# Patient Record
Sex: Male | Born: 1995 | Race: Asian | Hispanic: No | Marital: Single | Smoking: Never smoker
Health system: Southern US, Community
[De-identification: ages and names within clinical notes are randomized; demographics above are authoritative.]

## PROBLEM LIST (undated history)

## (undated) DIAGNOSIS — I10 Essential (primary) hypertension: Secondary | ICD-10-CM

## (undated) DIAGNOSIS — E538 Deficiency of other specified B group vitamins: Secondary | ICD-10-CM

## (undated) DIAGNOSIS — T7840XA Allergy, unspecified, initial encounter: Secondary | ICD-10-CM

## (undated) HISTORY — DX: Allergy, unspecified, initial encounter: T78.40XA

## (undated) HISTORY — DX: Deficiency of other specified B group vitamins: E53.8

## (undated) HISTORY — DX: Essential (primary) hypertension: I10

---

## 2018-08-10 ENCOUNTER — Ambulatory Visit (INDEPENDENT_AMBULATORY_CARE_PROVIDER_SITE_OTHER): Payer: PRIVATE HEALTH INSURANCE | Admitting: Internal Medicine

## 2018-08-10 ENCOUNTER — Encounter: Payer: Self-pay | Admitting: Internal Medicine

## 2018-08-10 ENCOUNTER — Other Ambulatory Visit: Payer: Self-pay

## 2018-08-10 VITALS — BP 110/80 | HR 87 | Temp 98.3°F | Ht 64.0 in | Wt 191.1 lb

## 2018-08-10 DIAGNOSIS — R05 Cough: Secondary | ICD-10-CM

## 2018-08-10 DIAGNOSIS — R053 Chronic cough: Secondary | ICD-10-CM

## 2018-08-10 NOTE — Patient Instructions (Signed)
-  Nice meeting you today!!  -Continue taking zyrtec/claritin every day.  -May use Delsym cough syrup twice a day as needed for cough.

## 2018-08-10 NOTE — Progress Notes (Signed)
New Patient Office Visit     CC/Reason for Visit: Establish care, evaluation of chronic cough Previous PCP: In [place] Last Visit: Winter 2019  HPI: Andrew Simpson is a 23 y.o. male who is coming in today for the above mentioned reasons.  He is without past medical history of significance.  He recently [place] from [facility].  He is currently unemployed.  He is a never smoker, does not drink alcohol, has never done drugs.  He does not have any family history of significance that he is aware of.  He states that for approximately the past 3 months he has had a cough that is worse at nighttime, was prescribed Zyrtec by his prior doctor and he stopped taking it because he has not noticed any difference.  He has no other symptoms like fever, shortness of breath, myalgias, no sick contacts, no recent travel other than moving from [place].   Past Medical/Surgical History: History reviewed. No pertinent past medical history.  History reviewed. No pertinent surgical history.  Social History:  reports that he has never smoked. He has never used smokeless tobacco. He reports that he does not drink alcohol or use drugs.  Allergies: No Known Allergies  Family History:  History reviewed. No pertinent family history.   Current Outpatient Medications:  .  cetirizine (ZYRTEC) 10 MG tablet, Take 10 mg by mouth daily., Disp: , Rfl:   Review of Systems:  Constitutional: Denies fever, chills, diaphoresis, appetite change and fatigue.  HEENT: Denies photophobia, eye pain, redness, hearing loss, ear pain, congestion, sore throat, rhinorrhea, sneezing, mouth sores, trouble swallowing, neck pain, neck stiffness and tinnitus.   Respiratory: Denies SOB, DOE, chest tightness,  and wheezing.   Cardiovascular: Denies chest pain, palpitations and leg swelling.  Gastrointestinal: Denies nausea, vomiting, abdominal pain, diarrhea, constipation, blood in stool and abdominal distention.   Genitourinary: Denies dysuria, urgency, frequency, hematuria, flank pain and difficulty urinating.  Endocrine: Denies: hot or cold intolerance, sweats, changes in hair or nails, polyuria, polydipsia. Musculoskeletal: Denies myalgias, back pain, joint swelling, arthralgias and gait problem.  Skin: Denies pallor, rash and wound.  Neurological: Denies dizziness, seizures, syncope, weakness, light-headedness, numbness and headaches.  Hematological: Denies adenopathy. Easy bruising, personal or family bleeding history  Psychiatric/Behavioral: Denies suicidal ideation, mood changes, confusion, nervousness, sleep disturbance and agitation    Physical Exam: Vitals:   08/10/18 1508  BP: 110/80  Pulse: 87  Temp: 98.3 F (36.8 C)  TempSrc: Oral  SpO2: 97%  Weight: 191 lb 1.6 oz (86.7 kg)  Height: 5\' 4"  (1.626 m)   Body mass index is 32.8 kg/m.  Constitutional: NAD, calm, comfortable Eyes: PERRL, lids and conjunctivae normal ENMT: Mucous membranes are moist. Posterior pharynx is erythematous but clear of any exudate or lesions. Normal dentition. Tympanic membrane is pearly white, no erythema or bulging. Neck: normal, supple, no masses, no thyromegaly, no lymphadenopathy Respiratory: clear to auscultation bilaterally, no wheezing, no crackles. Normal respiratory effort. No accessory muscle use.  Cardiovascular: Regular rate and rhythm, no murmurs / rubs / gallops. No extremity edema. 2+ pedal pulses. No carotid bruits.  Musculoskeletal: no clubbing / cyanosis. No joint deformity upper and lower extremities. Good ROM, no contractures. Normal muscle tone.  Psychiatric: Normal judgment and insight. Alert and oriented x 3. Normal mood.    Impression and Plan:  Chronic cough -Nothing on exam makes me believe that he needs imaging or antibiotic therapy. -Have asked him to resume use of Claritin,  he understands that seasonal allergies sometimes change with variation in location. -He may also use  other over-the-counter medications such as cough suppressants and Mucinex. -He will return to clinic if not improved or develops any additional symptoms.     Patient Instructions  -Nice meeting you today!!  -Continue taking zyrtec/claritin every day.  -May use Delsym cough syrup twice a day as needed for cough.     Chaya Jan, MD [facility]

## 2018-08-23 ENCOUNTER — Encounter (HOSPITAL_COMMUNITY): Payer: Self-pay

## 2018-08-23 ENCOUNTER — Other Ambulatory Visit: Payer: Self-pay

## 2018-08-23 ENCOUNTER — Emergency Department (HOSPITAL_COMMUNITY): Payer: PRIVATE HEALTH INSURANCE

## 2018-08-23 ENCOUNTER — Emergency Department (HOSPITAL_COMMUNITY)
Admission: EM | Admit: 2018-08-23 | Discharge: 2018-08-23 | Disposition: A | Discharge status: Home / Self Care | Payer: PRIVATE HEALTH INSURANCE | Attending: Emergency Medicine | Admitting: Emergency Medicine

## 2018-08-23 DIAGNOSIS — K6389 Other specified diseases of intestine: Secondary | ICD-10-CM

## 2018-08-23 DIAGNOSIS — Z79899 Other long term (current) drug therapy: Secondary | ICD-10-CM | POA: Insufficient documentation

## 2018-08-23 DIAGNOSIS — K529 Noninfective gastroenteritis and colitis, unspecified: Secondary | ICD-10-CM | POA: Diagnosis not present

## 2018-08-23 DIAGNOSIS — R109 Unspecified abdominal pain: Secondary | ICD-10-CM | POA: Diagnosis present

## 2018-08-23 LAB — LIPASE, BLOOD: Lipase: 21 U/L (ref 11–51)

## 2018-08-23 LAB — COMPREHENSIVE METABOLIC PANEL
ALT: 57 U/L — ABNORMAL HIGH (ref 0–44)
AST: 28 U/L (ref 15–41)
Albumin: 4.7 g/dL (ref 3.5–5.0)
Alkaline Phosphatase: 123 U/L (ref 38–126)
Anion gap: 9 (ref 5–15)
BUN: 7 mg/dL (ref 6–20)
CO2: 21 mmol/L — ABNORMAL LOW (ref 22–32)
Calcium: 9.3 mg/dL (ref 8.9–10.3)
Chloride: 108 mmol/L (ref 98–111)
Creatinine, Ser: 1 mg/dL (ref 0.61–1.24)
GFR calc Af Amer: >60 mL/min (ref >60)
GFR calc non Af Amer: >60 mL/min (ref >60)
Glucose, Bld: 102 mg/dL — ABNORMAL HIGH (ref 70–99)
Potassium: 3.5 mmol/L (ref 3.5–5.1)
Sodium: 138 mmol/L (ref 135–145)
Total Bilirubin: 2.7 mg/dL — ABNORMAL HIGH (ref 0.3–1.2)
Total Protein: 8.7 g/dL — ABNORMAL HIGH (ref 6.5–8.1)

## 2018-08-23 LAB — URINALYSIS, ROUTINE W REFLEX MICROSCOPIC
Bacteria, UA: NONE SEEN
Bilirubin Urine: NEGATIVE
Glucose, UA: NEGATIVE mg/dL
Ketones, ur: 5 mg/dL — AB
Leukocytes,Ua: NEGATIVE
Nitrite: NEGATIVE
Protein, ur: NEGATIVE mg/dL
Specific Gravity, Urine: 1.02 (ref 1.005–1.030)
pH: 6 (ref 5.0–8.0)

## 2018-08-23 LAB — CBC
HCT: 50.3 % (ref 39.0–52.0)
Hemoglobin: 16.5 g/dL (ref 13.0–17.0)
MCH: 31.5 pg (ref 26.0–34.0)
MCHC: 32.8 g/dL (ref 30.0–36.0)
MCV: 96 fL (ref 80.0–100.0)
Platelets: 364 10*3/uL (ref 150–400)
RBC: 5.24 MIL/uL (ref 4.22–5.81)
RDW: 11.7 % (ref 11.5–15.5)
WBC: 11.1 10*3/uL — ABNORMAL HIGH (ref 4.0–10.5)
nRBC: 0 % (ref 0.0–0.2)

## 2018-08-23 MED ORDER — SODIUM CHLORIDE (PF) 0.9 % IJ SOLN
INTRAMUSCULAR | Status: AC
  Filled 2018-08-23: qty 50

## 2018-08-23 MED ORDER — NAPROXEN 500 MG PO TABS: 500.0000 mg | ORAL_TABLET | Freq: Two times a day (BID) | ORAL | 0 refills | Status: DC

## 2018-08-23 MED ORDER — ONDANSETRON 4 MG PO TBDP: 4.0000 mg | ORAL_TABLET | Freq: Three times a day (TID) | ORAL | 0 refills | Status: DC | PRN

## 2018-08-23 MED ORDER — ONDANSETRON HCL 4 MG/2ML IJ SOLN: 4.0000 mg | INTRAMUSCULAR | Status: DC | PRN

## 2018-08-23 MED ORDER — MORPHINE SULFATE (PF) 4 MG/ML IV SOLN
4.0000 mg | Freq: Once | INTRAVENOUS | Status: AC
  Administered 2018-08-23: 4 mg via INTRAVENOUS
  Filled 2018-08-23 (×2): qty 1

## 2018-08-23 MED ORDER — SODIUM CHLORIDE 0.9% FLUSH
3.0000 mL | Freq: Once | INTRAVENOUS | Status: AC
  Administered 2018-08-23: 3 mL via INTRAVENOUS

## 2018-08-23 MED ORDER — SODIUM CHLORIDE 0.9 % IV SOLN
Freq: Once | INTRAVENOUS | Status: AC
  Administered 2018-08-23: 20:00:00 via INTRAVENOUS

## 2018-08-23 MED ORDER — IOHEXOL 300 MG/ML  SOLN
100.0000 mL | Freq: Once | INTRAMUSCULAR | Status: AC | PRN
  Administered 2018-08-23: 100 mL via INTRAVENOUS

## 2018-08-23 MED ORDER — KETOROLAC TROMETHAMINE 30 MG/ML IJ SOLN
30.0000 mg | Freq: Once | INTRAMUSCULAR | Status: DC
  Filled 2018-08-23: qty 1

## 2018-08-23 NOTE — ED Provider Notes (Signed)
[facility]   CSN: [id number] Arrival date & time: 08/23/18  1731    History   Chief Complaint Chief Complaint  Patient presents with   Abdominal Pain    HPI Andrew Simpson is a 23 y.o. male without PMH here for evaluation of abdominal pain. Onset 130 am last night. Initially around umbilicus, generalized. Persistent until now, localized to right mid/low abdomen. 8/10. Crampy, constant but worse with moving around and palpation.  Associated with one episode of non bloody watery diarrhea yesterday. No associated fever, nausea, vomiting, melena, hematochezia, urinary symptoms. No h/o abd surgeries. No interventions. No alleviating factors. No h/o kidney stones.      HPI  History reviewed. No pertinent past medical history.  There are no active problems to display for this patient.   History reviewed. No pertinent surgical history.      Home Medications    Prior to Admission medications   Medication Sig Start Date End Date Taking? Authorizing Provider  cetirizine (ZYRTEC) 10 MG tablet Take 10 mg by mouth daily.   Yes [provider]  Dextromethorphan Polistirex (DELSYM PO) Take 10 mLs by mouth daily as needed (cough).   Yes [provider]  naproxen (NAPROSYN) 500 MG tablet Take 1 tablet (500 mg total) by mouth 2 (two) times daily. 08/23/18   Liberty Handy, PA-C  ondansetron (ZOFRAN ODT) 4 MG disintegrating tablet Take 1 tablet (4 mg total) by mouth every 8 (eight) hours as needed for nausea or vomiting. 08/23/18   Liberty Handy, PA-C    Family History History reviewed. No pertinent family history.  Social History Social History   Tobacco Use   Smoking status: Never Smoker   Smokeless tobacco: Never Used  Substance Use Topics   Alcohol use: Never    Frequency: Never   Drug use: Never     Allergies   Patient has no known allergies.   Review of Systems Review of Systems    Gastrointestinal: Positive for abdominal pain and diarrhea.  All other systems reviewed and are negative.    Physical Exam Updated Vital Signs BP (!) 140/97    Pulse 96    Temp 98.9 F (37.2 C) (Oral)    Resp 17    SpO2 100%   Physical Exam Vitals signs and nursing note reviewed.  Constitutional:      Appearance: He is well-developed.     Comments: Non toxic.  HENT:     Head: Normocephalic and atraumatic.     Nose: Nose normal.  Eyes:     Conjunctiva/sclera: Conjunctivae normal.     Pupils: Pupils are equal, round, and reactive to light.  Neck:     Musculoskeletal: Normal range of motion.  Cardiovascular:     Rate and Rhythm: Normal rate and regular rhythm.     Pulses:          Radial pulses are 1+ on the right side and 1+ on the left side.       Dorsalis pedis pulses are 1+ on the right side and 1+ on the left side.     Heart sounds: Normal heart sounds.  Pulmonary:     Effort: Pulmonary effort is normal.     Breath sounds: Normal breath sounds.  Abdominal:     General: Bowel sounds are normal.     Palpations: Abdomen is soft.     Tenderness: There is abdominal tenderness. Positive signs include Rovsing's sign, McBurney's sign and  psoas sign.     Comments: No G/R/R. No suprapubic or CVA tenderness. Negative Murphy's. Active BS to lowe quadrants.   Musculoskeletal: Normal range of motion.  Skin:    General: Skin is warm and dry.     Capillary Refill: Capillary refill takes less than 2 seconds.  Neurological:     Mental Status: He is alert and oriented to person, place, and time.  Psychiatric:        Behavior: Behavior normal.        Thought Content: Thought content normal.        Judgment: Judgment normal.      ED Treatments / Results  Labs (all labs ordered are listed, but only abnormal results are displayed) Labs Reviewed  COMPREHENSIVE METABOLIC PANEL - Abnormal; Notable for the following components:      Result Value   CO2 21 (*)    Glucose, Bld 102 (*)     Total Protein 8.7 (*)    ALT 57 (*)    Total Bilirubin 2.7 (*)    All other components within normal limits  CBC - Abnormal; Notable for the following components:   WBC 11.1 (*)    All other components within normal limits  URINALYSIS, ROUTINE W REFLEX MICROSCOPIC - Abnormal; Notable for the following components:   Hgb urine dipstick SMALL (*)    Ketones, ur 5 (*)    All other components within normal limits  LIPASE, BLOOD    EKG None  Radiology Ct Abdomen Pelvis W Contrast  Result Date: 08/23/2018 CLINICAL DATA:  Initial evaluation for acute abdominal pain. EXAM: CT ABDOMEN AND PELVIS WITH CONTRAST TECHNIQUE: Multidetector CT imaging of the abdomen and pelvis was performed using the standard protocol following bolus administration of intravenous contrast. CONTRAST:  OMNIPAQUE IOHEXOL 300 MG/ML  SOLN COMPARISON:  None. FINDINGS: Lower chest: Minimal subsegmental atelectatic changes seen within the visualized lung bases. Visualized lungs are otherwise clear. Hepatobiliary: Liver demonstrates a normal contrast enhanced appearance. Focal fatty infiltration noted adjacent to the falciform ligament. Gallbladder within normal limits. No biliary dilatation. Pancreas: Pancreas within normal limits. Spleen: Spleen within normal limits. Adrenals/Urinary Tract: Adrenal glands are normal. Kidneys equal in size with symmetric enhancement no nephrolithiasis, hydronephrosis or focal enhancing renal mass. No hydroureter. Partially distended bladder within normal limits. Stomach/Bowel: Stomach within normal limits. No evidence for bowel obstruction. Focal somewhat well-circumscribed and ovoid area of inflammatory fat stranding seen along the anterior margin of the cecum (series 2, image 52). Overall appearance most characteristic for acute epiploic appendagitis. Central nodule densities within the area of inflammation likely small lymph nodes and/or thrombosed veins. Circumferential wall thickening with  mucosal edema seen within the adjacent cecum. Ileum and portions of the distal small bowel also decompressed with associated wall thickening, likely reactive. Appendix thick self seen extending inferiorly from the cecum, measuring up to 7 mm in diameter. While closely approximated to this inflammatory process, the epicenter of the inflammation felt to be about the cecum rather than the in the appendix itself. No evidence for acute appendicitis. No other acute inflammatory changes seen about the bowels. Vascular/Lymphatic: Normal intravascular enhancement seen throughout the intra-abdominal aorta. Mesenteric vessels patent proximally. No other adenopathy. Reproductive: Prostate normal. Other: No free air or fluid. Musculoskeletal: No acute osseous finding. No discrete lytic or blastic osseous lesions. IMPRESSION: 1. Focal inflammatory fat stranding adjacent to the cecum, felt to be most consistent with acute epiploic appendagitis. Associated mild reactive wall thickening and edema within the adjacent  cecum and distal ileum. 2. No other acute intra-abdominal or pelvic process. No evidence for acute appendicitis. Electronically Signed   By: Rise Mu M.D.   On: 08/23/2018 20:34    Procedures Procedures (including critical care time)  Medications Ordered in ED Medications  ondansetron (ZOFRAN) injection 4 mg (has no administration in time range)  sodium chloride (PF) 0.9 % injection (has no administration in time range)  ketorolac (TORADOL) 30 MG/ML injection 30 mg (30 mg Intravenous Refused 08/23/18 2058)  sodium chloride flush (NS) 0.9 % injection 3 mL (3 mLs Intravenous Given 08/23/18 1830)  0.9 %  sodium chloride infusion ( Intravenous Stopped 08/23/18 2135)  morphine 4 MG/ML injection 4 mg (4 mg Intravenous Given 08/23/18 1927)  iohexol (OMNIPAQUE) 300 MG/ML solution 100 mL (100 mLs Intravenous Contrast Given 08/23/18 2007)     Initial Impression / Assessment and Plan / ED Course  I have  reviewed the triage vital signs and the nursing notes.  Pertinent labs & imaging results that were available during my care of the patient were reviewed by me and considered in my medical decision making (see chart for details).  Clinical Course as of Aug 23 2138  Wed Aug 23, 2018  1923 WBC(!): 11.1 [CG]    Clinical Course User Index [CG] Liberty Handy, PA-C       ddx includes viral process vs appendicitis vs R sided diverticulitis. HD stable w/o distal paresthesias or neuro deficits and doubt AAA, dissection. No urinary symptoms. No h/o renal stones.  No SIRS. Will obtain labs, reassess. Consider CT vs [place].   2138: WBC 11.1. Total bilirubin 2.7, ALT 57.  CT AP shows epiploic appendagitis and local reactive inflammation.  Repeat abd exam improved. Pt tolerating PO. Will dc with NSAID and zofran. Return precautions given. Pt comfortable with this.   Final Clinical Impressions(s) / ED Diagnoses   Final diagnoses:  Epiploic appendagitis    ED Discharge Orders         Ordered    ondansetron (ZOFRAN ODT) 4 MG disintegrating tablet  Every 8 hours PRN     08/23/18 2130    naproxen (NAPROSYN) 500 MG tablet  2 times daily     08/23/18 2130           Jerrell Mylar 08/23/18 2140    Raeford Razor, MD 08/23/18 2230

## 2018-08-23 NOTE — ED Notes (Signed)
Pt given orange juice for PO challenge

## 2018-08-23 NOTE — ED Notes (Signed)
EDPA Provider at bedside. 

## 2018-08-23 NOTE — ED Notes (Signed)
LAB LABELS OFF LINE. SECRETARY MADE AWARE

## 2018-08-23 NOTE — Discharge Instructions (Addendum)
You were seen in the ED for abdominal pain.   CT shows inflammation of area around appendix but no appendicitis.  This is called epiploic appendagitis. This is treated with anti-inflammatories and nausea medicine  Take [phone] mg acetaminophen (tylenol) every 6 hours.  For more pain control you can take naproxen 500 mg every 12 hours.  Ondansetron for nausea.  Your pain may linger for 1-3 weeks but should improve with anti-inflammatories.   Return for worsening pain, vomiting, diarrhea, fevers, chills.

## 2018-08-23 NOTE — ED Triage Notes (Signed)
Pt reports severe lower abdominal cramping that started last night. He states that he had one episode of diarrhea last night around 230, but the pain persisted. He states that the pain has lessened now, but is still there. He denies any visible blood in his stool. No distress noted, but pt is tachycardic.

## 2018-10-05 ENCOUNTER — Encounter: Payer: Self-pay | Admitting: Internal Medicine

## 2018-10-05 ENCOUNTER — Other Ambulatory Visit: Payer: Self-pay

## 2018-10-05 ENCOUNTER — Ambulatory Visit (INDEPENDENT_AMBULATORY_CARE_PROVIDER_SITE_OTHER): Payer: PRIVATE HEALTH INSURANCE | Admitting: Internal Medicine

## 2018-10-05 VITALS — BP 104/70 | HR 65 | Temp 98.4°F | Wt 187.8 lb

## 2018-10-05 DIAGNOSIS — R202 Paresthesia of skin: Secondary | ICD-10-CM

## 2018-10-05 LAB — TSH: TSH: 1.18 u[IU]/mL (ref 0.35–4.50)

## 2018-10-05 LAB — VITAMIN B12: Vitamin B-12: 138 pg/mL — ABNORMAL LOW (ref 211–911)

## 2018-10-05 NOTE — Progress Notes (Signed)
     Established Patient Office Visit     CC/Reason for Visit: numbness and tingling of torso  HPI: Andrew Simpson is a 23 y.o. male who is coming in today for the above mentioned reasons. No PMH of significance. For 2-3 weeks has been having tingling of his right upper chest above the nipple line, radiating in a circular fashion to his back. Does not recall any heavy lifting or injury, no tick/insect bite, has not had a rash. Never had something like this before.   Past Medical/Surgical History: History reviewed. No pertinent past medical history.  No past surgical history on file.  Social History:  reports that he has never smoked. He has never used smokeless tobacco. He reports that he does not drink alcohol or use drugs.  Allergies: No Known Allergies  Family History:  No history that he is aware of, including heart disease, cancer or stroke.  Current Outpatient Medications:  .  diphenhydrAMINE (BENADRYL) 25 mg capsule, Take 25 mg by mouth every 6 (six) hours as needed., Disp: , Rfl:  .  ondansetron (ZOFRAN ODT) 4 MG disintegrating tablet, Take 1 tablet (4 mg total) by mouth every 8 (eight) hours as needed for nausea or vomiting., Disp: 20 tablet, Rfl: 0  Review of Systems:  Constitutional: Denies fever, chills, diaphoresis, appetite change and fatigue.  HEENT: Denies photophobia, eye pain, redness, hearing loss, ear pain, congestion, sore throat, rhinorrhea, sneezing, mouth sores, trouble swallowing, neck pain, neck stiffness and tinnitus.   Respiratory: Denies SOB, DOE, cough, chest tightness,  and wheezing.   Cardiovascular: Denies chest pain, palpitations and leg swelling.  Gastrointestinal: Denies nausea, vomiting, abdominal pain, diarrhea, constipation, blood in stool and abdominal distention.  Genitourinary: Denies dysuria, urgency, frequency, hematuria, flank pain and difficulty urinating.  Endocrine: Denies: hot or cold intolerance, sweats, changes in hair or  nails, polyuria, polydipsia. Musculoskeletal: Denies myalgias, back pain, joint swelling, arthralgias and gait problem.  Skin: Denies pallor, rash and wound.  Neurological: Denies dizziness, seizures, syncope, weakness, light-headedness, numbness and headaches.  Hematological: Denies adenopathy. Easy bruising, personal or family bleeding history  Psychiatric/Behavioral: Denies suicidal ideation, mood changes, confusion, nervousness, sleep disturbance and agitation    Physical Exam: Vitals:   10/05/18 1302  BP: 104/70  Pulse: 65  Temp: 98.4 F (36.9 C)  TempSrc: Oral  SpO2: 97%  Weight: 187 lb 12.8 oz (85.2 kg)    Body mass index is 32.24 kg/m.   Constitutional: NAD, calm, comfortable Eyes: PERRL, lids and conjunctivae normal, wears corrective lenses ENMT: Mucous membranes are moist.  Neck: normal, supple, no masses, no thyromegaly Respiratory: clear to auscultation bilaterally, no wheezing, no crackles. Skin: no rashes, lesions, ulcers. No induration Neurologic: grossly intact and non-focal. Psychiatric: Normal judgment and insight. Alert and oriented x 3. Normal mood.    Impression and Plan:  Tingling of skin  -Etiology remains unclear. -Doubt early shingles as it has been 3 weeks and he still has not had a rash of any sort. -Will check TSH and vit B12; if normal and tingling doesn't resolve over next 2 weeks, consider neurology referral.    Patient Instructions  -I hope you fell better soon!  -Lab work today; will notify you when results are available.  -Please let me know if you are still having issues in 2 weeks.       Chaya Jan, MD [facility]

## 2018-10-05 NOTE — Patient Instructions (Signed)
-  I hope you fell better soon!  -Lab work today; will notify you when results are available.  -Please let me know if you are still having issues in 2 weeks.

## 2018-10-06 ENCOUNTER — Telehealth: Payer: Self-pay | Admitting: *Deleted

## 2018-10-06 NOTE — Telephone Encounter (Signed)
Patient is scheduled for his 1st B12 injection in office and will be educated on how to self administer.

## 2018-10-06 NOTE — Telephone Encounter (Signed)
Patient is calling because he will receive his first B12 injection 10/09/2018.  Patient has questions concerning costs. Could you please help?

## 2018-10-06 NOTE — Telephone Encounter (Signed)
As we discussed during his visit, oral B12 is not an adequate option for replacement, at least not initially.

## 2018-10-06 NOTE — Telephone Encounter (Signed)
Out of pocket cost is $125 per injection when given in the office. Would it be possible to teach pt to self-administer for less expense?

## 2018-10-06 NOTE — Telephone Encounter (Signed)
Spoke with pt and provided CPT codes for B12 injections. He states his insurance has already advised him they will not cover B12 injections. Pt is concerned about cost. Pt would like to know if oral supplement would be an option. Please advise.

## 2018-10-06 NOTE — Telephone Encounter (Signed)
Patient is aware 

## 2018-10-09 ENCOUNTER — Other Ambulatory Visit: Payer: Self-pay

## 2018-10-09 ENCOUNTER — Ambulatory Visit (INDEPENDENT_AMBULATORY_CARE_PROVIDER_SITE_OTHER): Payer: PRIVATE HEALTH INSURANCE | Admitting: *Deleted

## 2018-10-09 DIAGNOSIS — E538 Deficiency of other specified B group vitamins: Secondary | ICD-10-CM | POA: Diagnosis not present

## 2018-10-09 MED ORDER — CYANOCOBALAMIN 1000 MCG/ML IJ SOLN
1000.0000 ug | Freq: Once | INTRAMUSCULAR | Status: AC
  Administered 2018-10-09: 1000 ug via INTRAMUSCULAR

## 2018-10-27 NOTE — Progress Notes (Signed)
Agree with B12 injection as given.

## 2018-11-23 ENCOUNTER — Other Ambulatory Visit: Payer: Self-pay

## 2018-11-23 ENCOUNTER — Ambulatory Visit (INDEPENDENT_AMBULATORY_CARE_PROVIDER_SITE_OTHER): Payer: PRIVATE HEALTH INSURANCE | Admitting: Internal Medicine

## 2018-11-23 DIAGNOSIS — R05 Cough: Secondary | ICD-10-CM

## 2018-11-23 DIAGNOSIS — E538 Deficiency of other specified B group vitamins: Secondary | ICD-10-CM

## 2018-11-23 DIAGNOSIS — R059 Cough, unspecified: Secondary | ICD-10-CM

## 2018-11-23 MED ORDER — BENZONATATE 100 MG PO CAPS: 100.0000 mg | ORAL_CAPSULE | Freq: Three times a day (TID) | ORAL | 0 refills | Status: DC | PRN

## 2018-11-23 NOTE — Progress Notes (Signed)
Virtual Visit via Video Note  I connected with Andrew Simpson on 11/23/18 at  1:30 PM EDT by a video enabled telemedicine application and verified that I am speaking with the correct person using two identifiers.  Location patient: home Location provider: work office Persons participating in the virtual visit: patient, provider  I discussed the limitations of evaluation and management by telemedicine and the availability of in person appointments. The patient expressed understanding and agreed to proceed.   HPI: He has scheduled this visit to discuss a cough. He has had it since Jan. It will sometimes produce clear sputum, will sometimes get coughing fits. Seen once in the office for it and was told he had an unspecified URI. He tried claritin and zyrtec OTC briefly without much relief. Tried OTC delsym as well. No fever, SOB, nasal congestion, sinus pain, no ear pain. No sick contacts. Is not working, has not left the house other than for groceries.  He would like to schedule a nurse visit for his B12 injection.   ROS: Constitutional: Denies fever, chills, diaphoresis, appetite change and fatigue.  HEENT: Denies photophobia, eye pain, redness, hearing loss, ear pain, congestion, sore throat, rhinorrhea, sneezing, mouth sores, trouble swallowing, neck pain, neck stiffness and tinnitus.   Respiratory: Denies SOB, DOE, cough, chest tightness,  and wheezing.   Cardiovascular: Denies chest pain, palpitations and leg swelling.  Gastrointestinal: Denies nausea, vomiting, abdominal pain, diarrhea, constipation, blood in stool and abdominal distention.  Genitourinary: Denies dysuria, urgency, frequency, hematuria, flank pain and difficulty urinating.  Endocrine: Denies: hot or cold intolerance, sweats, changes in hair or nails, polyuria, polydipsia. Musculoskeletal: Denies myalgias, back pain, joint swelling, arthralgias and gait problem.  Skin: Denies pallor, rash and wound.   Neurological: Denies dizziness, seizures, syncope, weakness, light-headedness, numbness and headaches.  Hematological: Denies adenopathy. Easy bruising, personal or family bleeding history  Psychiatric/Behavioral: Denies suicidal ideation, mood changes, confusion, nervousness, sleep disturbance and agitation   No past medical history on file.  No past surgical history on file.  No h/o heart disease, cancer or stroke in family that he is aware of.  SOCIAL HX:   reports that he has never smoked. He has never used smokeless tobacco. He reports that he does not drink alcohol or use drugs.   Current Outpatient Medications:  .  benzonatate (TESSALON) 100 MG capsule, Take 1 capsule (100 mg total) by mouth 3 (three) times daily as needed for cough., Disp: 30 capsule, Rfl: 0 .  ondansetron (ZOFRAN ODT) 4 MG disintegrating tablet, Take 1 tablet (4 mg total) by mouth every 8 (eight) hours as needed for nausea or vomiting., Disp: 20 tablet, Rfl: 0  EXAM:   VITALS per patient if applicable: none reported  GENERAL: alert, oriented, appears well and in no acute distress  HEENT: atraumatic, conjunttiva clear, no obvious abnormalities on inspection of external nose and ears  NECK: normal movements of the head and neck  LUNGS: on inspection no signs of respiratory distress, breathing rate appears normal, no obvious gross increased work of breathing, gasping or wheezing  CV: no obvious cyanosis  MS: moves all visible extremities without noticeable abnormality  PSYCH/NEURO: pleasant and cooperative, no obvious depression or anxiety, speech and thought processing grossly intact  ASSESSMENT AND PLAN:   Cough  -No concerning symptoms to suggest PNA, pharyngitis or ear infection. -Continue to follow conservatively: daily antihistamine use, will send a Rx for tessalon perles. -Does not want to be testing for COVID (I agree  he is low-test probability).  B12 deficiency -Will ask that he be  scheduled for in-office B12 injection.     I discussed the assessment and treatment plan with the patient. The patient was provided an opportunity to ask questions and all were answered. The patient agreed with the plan and demonstrated an understanding of the instructions.   The patient was advised to call back or seek an in-person evaluation if the symptoms worsen or if the condition fails to improve as anticipated.    Lelon Frohlich, MD  [facility] at [facility]

## 2018-12-20 ENCOUNTER — Ambulatory Visit: Payer: PRIVATE HEALTH INSURANCE

## 2019-01-08 ENCOUNTER — Other Ambulatory Visit: Payer: Self-pay

## 2019-01-08 ENCOUNTER — Ambulatory Visit (INDEPENDENT_AMBULATORY_CARE_PROVIDER_SITE_OTHER): Payer: PRIVATE HEALTH INSURANCE | Admitting: *Deleted

## 2019-01-08 DIAGNOSIS — E538 Deficiency of other specified B group vitamins: Secondary | ICD-10-CM | POA: Diagnosis not present

## 2019-01-08 MED ORDER — CYANOCOBALAMIN 1000 MCG/ML IJ SOLN
1000.0000 ug | Freq: Once | INTRAMUSCULAR | Status: AC
  Administered 2019-01-08: 1000 ug via INTRAMUSCULAR

## 2019-01-08 NOTE — Progress Notes (Signed)
Per orders of Dr. Burchette, injection of Cyanocobalamin 1000mcg given by Taler Kushner A. Patient tolerated injection well.  

## 2019-01-15 ENCOUNTER — Encounter: Payer: Self-pay | Admitting: Internal Medicine

## 2019-01-28 ENCOUNTER — Encounter: Payer: Self-pay | Admitting: Internal Medicine

## 2019-01-28 DIAGNOSIS — R1031 Right lower quadrant pain: Secondary | ICD-10-CM

## 2019-02-07 ENCOUNTER — Other Ambulatory Visit: Payer: Self-pay

## 2019-02-07 ENCOUNTER — Encounter: Payer: Self-pay | Admitting: Gastroenterology

## 2019-02-07 ENCOUNTER — Ambulatory Visit (INDEPENDENT_AMBULATORY_CARE_PROVIDER_SITE_OTHER): Payer: PRIVATE HEALTH INSURANCE | Admitting: *Deleted

## 2019-02-07 DIAGNOSIS — Z23 Encounter for immunization: Secondary | ICD-10-CM | POA: Diagnosis not present

## 2019-02-07 DIAGNOSIS — E538 Deficiency of other specified B group vitamins: Secondary | ICD-10-CM | POA: Diagnosis not present

## 2019-02-07 MED ORDER — CYANOCOBALAMIN 1000 MCG/ML IJ SOLN
1000.0000 ug | Freq: Once | INTRAMUSCULAR | Status: AC
  Administered 2019-02-07: 1000 ug via INTRAMUSCULAR

## 2019-02-07 NOTE — Progress Notes (Signed)
Per orders of Dr. Hernandez, injection of B12 given by Ronna Herskowitz. Patient tolerated injection well.  

## 2019-03-07 ENCOUNTER — Other Ambulatory Visit: Payer: Self-pay

## 2019-03-07 ENCOUNTER — Ambulatory Visit (INDEPENDENT_AMBULATORY_CARE_PROVIDER_SITE_OTHER): Payer: PRIVATE HEALTH INSURANCE | Admitting: *Deleted

## 2019-03-07 DIAGNOSIS — E538 Deficiency of other specified B group vitamins: Secondary | ICD-10-CM | POA: Diagnosis not present

## 2019-03-07 MED ORDER — CYANOCOBALAMIN 1000 MCG/ML IJ SOLN
1000.0000 ug | Freq: Once | INTRAMUSCULAR | Status: AC
  Administered 2019-03-07: 1000 ug via INTRAMUSCULAR

## 2019-03-07 NOTE — Progress Notes (Signed)
Per orders of Dr. Ethlyn Gallery, injection of Cyanocobalamin 1000ncg given by Agnes Lawrence. Patient tolerated injection well.

## 2019-03-14 ENCOUNTER — Ambulatory Visit (INDEPENDENT_AMBULATORY_CARE_PROVIDER_SITE_OTHER): Payer: PRIVATE HEALTH INSURANCE | Admitting: Gastroenterology

## 2019-03-14 VITALS — BP 118/80 | HR 87 | Temp 97.0°F | Ht 64.0 in | Wt 184.6 lb

## 2019-03-14 DIAGNOSIS — R933 Abnormal findings on diagnostic imaging of other parts of digestive tract: Secondary | ICD-10-CM

## 2019-03-14 DIAGNOSIS — R1031 Right lower quadrant pain: Secondary | ICD-10-CM | POA: Diagnosis not present

## 2019-03-14 MED ORDER — DICYCLOMINE HCL 10 MG PO CAPS: 10.0000 mg | ORAL_CAPSULE | Freq: Three times a day (TID) | ORAL | 1 refills | Status: DC | PRN

## 2019-03-14 NOTE — Progress Notes (Signed)
[facility] Consult Note:  History: Andrew PotashVictor Dai Simpson 03/14/2019  Referring provider: Philip AspenHernandez Simpson, Andrew PatriciaEstela Y, MD  Reason for consult/chief complaint: Abdominal Pain (comes and go for several years can last for at least a week, always lower right side)   Subjective  HPI:  This is a very pleasant 23 year old man referred by primary care for intermittent right lower quadrant pain.  It has occurred for the last few years, often happening at night for unclear reasons.  It is a dull right lower quadrant pain that may progress to a sharp pain that he rates as an 8 or 9 out of 10.  There is no associated nausea vomiting diarrhea constipation or rectal bleeding with these episodes.  Once an episode occurs, it may last days or up to a week afterwards.  He has not taken any particular medicines for it.  Some testing was done at an ED visit for the symptoms earlier in the year.  In between episodes he feels well.  Denies chronic upper digestive symptoms such as nausea, vomiting, early satiety, dysphagia and denies weight loss.   ROS:  Review of Systems  Constitutional: Negative for appetite change and unexpected weight change.  HENT: Negative for mouth sores and voice change.   Eyes: Negative for pain and redness.  Respiratory: Negative for cough and shortness of breath.   Cardiovascular: Negative for chest pain and palpitations.  Genitourinary: Negative for dysuria and hematuria.  Musculoskeletal: Negative for arthralgias and myalgias.  Skin: Negative for pallor and rash.  Neurological: Negative for weakness and headaches.  Hematological: Negative for adenopathy.     Past Medical History: Past Medical History:  Diagnosis Date  . B12 deficiency      Past Surgical History: History reviewed. No pertinent surgical history.   Family History: Family History  Problem Relation Age of Onset  . Liver cancer Father        [id number]  . Alcoholism Father      Social History: Social History   Socioeconomic History  . Marital status: Single    Spouse name: Not on file  . Number of children: Not on file  . Years of education: Not on file  . Highest education level: Not on file  Occupational History  . Not on file  Social Needs  . Financial resource strain: Not on file  . Food insecurity    Worry: Not on file    Inability: Not on file  . Transportation needs    Medical: Not on file    Non-medical: Not on file  Tobacco Use  . Smoking status: Never Smoker  . Smokeless tobacco: Never Used  Substance and Sexual Activity  . Alcohol use: Never    Frequency: Never  . Drug use: Never  . Sexual activity: Not on file  Lifestyle  . Physical activity    Days per week: Not on file    Minutes per session: Not on file  . Stress: Not on file  Relationships  . Social Musicianconnections    Talks on phone: Not on file    Gets together: Not on file    Attends religious service: Not on file    Active member of club or organization: Not on file    Attends meetings of clubs or organizations: Not on file    Relationship status: Not on file  Other Topics Concern  . Not on file  Social History Narrative  . Not on file   Recently moved to this  area from [place] to get some independence from his family.  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  Allergies: No Known Allergies  Outpatient Meds: Current Outpatient Medications  Medication Sig Dispense Refill  . dicyclomine (BENTYL) 10 MG capsule Take 1 capsule (10 mg total) by mouth every 8 (eight) hours as needed for spasms. 30 capsule 1   No current facility-administered medications for this visit.       ___________________________________________________________________ Objective   Exam:  BP 118/80   Pulse 87   Temp (!) 97 F (36.1 C)   Ht 5\' 4"  (1.626 m)   Wt 184 lb 9.6 oz (83.7 kg)   BMI 31.69 kg/m    General: Well-appearing  Eyes:  sclera anicteric, no redness  ENT: oral mucosa moist without lesions, no cervical or supraclavicular lymphadenopathy  CV: RRR without murmur, S1/S2, no JVD, no peripheral edema  Resp: clear to auscultation bilaterally, normal RR and effort noted  GI: soft, no tenderness, with active bowel sounds. No guarding or palpable organomegaly noted.  Skin; warm and dry, no rash or jaundice noted  Neuro: awake, alert and oriented x 3. Normal gross motor function and fluent speech  Labs:  CBC Latest Ref Rng & Units 08/23/2018  WBC 4.0 - 10.5 K/uL 11.1(H)  Hemoglobin 13.0 - 17.0 g/dL 08/25/2018  Hematocrit 00.9 - 52.0 % 50.3  Platelets 150 - 400 K/uL 364   CMP Latest Ref Rng & Units 08/23/2018  Glucose 70 - 99 mg/dL 08/25/2018)  BUN 6 - 20 mg/dL 7  Creatinine 829(H - 3.71 mg/dL 6.96  Sodium 7.89 - 381 mmol/L 138  Potassium 3.5 - 5.1 mmol/L 3.5  Chloride 98 - 111 mmol/L 108  CO2 22 - 32 mmol/L 21(L)  Calcium 8.9 - 10.3 mg/dL 9.3  Total Protein 6.5 - 8.1 g/dL 017)  Total Bilirubin 0.3 - 1.2 mg/dL 2.7(H)  Alkaline Phos 38 - 126 U/L 123  AST 15 - 41 U/L 28  ALT 0 - 44 U/L 57(H)     Radiologic Studies:  CLINICAL DATA:  Initial evaluation for acute abdominal pain.   EXAM: CT ABDOMEN AND PELVIS WITH CONTRAST   TECHNIQUE: Multidetector CT imaging of the abdomen and pelvis was performed using the standard protocol following bolus administration of intravenous contrast.   CONTRAST:  5.1(W OMNIPAQUE IOHEXOL 300 MG/ML  SOLN   COMPARISON:  None.   FINDINGS: Lower chest: Minimal subsegmental atelectatic changes seen within the visualized lung bases. Visualized lungs are otherwise clear.   Hepatobiliary: Liver demonstrates a normal contrast enhanced appearance. Focal fatty infiltration noted adjacent to the falciform ligament. Gallbladder within normal limits. No biliary dilatation.   Pancreas: Pancreas within normal limits.   Spleen: Spleen within normal limits.   Adrenals/Urinary Tract:  Adrenal glands are normal. Kidneys equal in size with symmetric enhancement no nephrolithiasis, hydronephrosis or focal enhancing renal mass. No hydroureter. Partially distended bladder within normal limits.   Stomach/Bowel: Stomach within normal limits. No evidence for bowel obstruction. Focal somewhat well-circumscribed and ovoid area of inflammatory fat stranding seen along the anterior margin of the cecum (series 2, image 52). Overall appearance most characteristic for acute epiploic appendagitis. Central nodule densities within the area of inflammation likely small lymph nodes and/or thrombosed veins. Circumferential wall thickening with mucosal edema seen within the adjacent cecum. Ileum and portions of the distal small bowel also decompressed with associated wall thickening, likely reactive. Appendix thick self seen extending inferiorly from  the cecum, measuring up to 7 mm in diameter. While closely approximated to this inflammatory process, the epicenter of the inflammation felt to be about the cecum rather than the in the appendix itself. No evidence for acute appendicitis. No other acute inflammatory changes seen about the bowels.   Vascular/Lymphatic: Normal intravascular enhancement seen throughout the intra-abdominal aorta. Mesenteric vessels patent proximally. No other adenopathy.   Reproductive: Prostate normal.   Other: No free air or fluid.   Musculoskeletal: No acute osseous finding. No discrete lytic or blastic osseous lesions.   IMPRESSION: 1. Focal inflammatory fat stranding adjacent to the cecum, felt to be most consistent with acute epiploic appendagitis. Associated mild reactive wall thickening and edema within the adjacent cecum and distal ileum. 2. No other acute intra-abdominal or pelvic process. No evidence for acute appendicitis.     Electronically Signed   By: Jeannine Boga M.D.   On: 08/23/2018 20:34   Assessment: Encounter  Diagnoses  Name Primary?  . RLQ abdominal pain Yes  . Abnormal finding on GI tract imaging     Symptoms somewhat difficult to characterize, no associated symptoms are clear triggers.  Given its location and CT findings, must consider Crohn's disease.  I recommended a colonoscopy, which he was reluctant to do without knowing the cost.  We gave him appropriate information so we can contact his insurance, and I am hopeful he will then contact us if he decides to schedule it. Meanwhile, I gave him a trial of dicyclomine to take as needed when symptoms occur.   Thank you for the courtesy of this consult.  Please call me with any questions or concerns.  Nelida Meuse III  CC: Referring provider noted above

## 2019-03-14 NOTE — Patient Instructions (Addendum)
If you are age 22 or older, your body mass index should be between 23-30. Your Body mass index is 31.69 kg/m. If this is out of the aforementioned range listed, please consider follow up with your Primary Care Provider.  If you are age 93 or younger, your body mass index should be between 19-25. Your Body mass index is 31.69 kg/m. If this is out of the aformentioned range listed, please consider follow up with your Primary Care Provider.   It has been recommended to you by your physician that you have a(n) Colonoscopy completed. Per your request, we did not schedule the procedure(s) today. Please contact our office at [phone] should you decide to have the procedure completed. You will be scheduled for a pre-visit and procedure at that time.  CPT for a colonoscopy [phone]  It was a pleasure to see you today!  Dr. Loletha Carrow

## 2019-03-15 ENCOUNTER — Encounter: Payer: Self-pay | Admitting: Gastroenterology

## 2019-03-23 ENCOUNTER — Other Ambulatory Visit: Payer: Self-pay

## 2019-03-23 ENCOUNTER — Telehealth (INDEPENDENT_AMBULATORY_CARE_PROVIDER_SITE_OTHER): Payer: PRIVATE HEALTH INSURANCE | Admitting: Family Medicine

## 2019-03-23 DIAGNOSIS — R059 Cough, unspecified: Secondary | ICD-10-CM

## 2019-03-23 DIAGNOSIS — M542 Cervicalgia: Secondary | ICD-10-CM | POA: Diagnosis not present

## 2019-03-23 DIAGNOSIS — R05 Cough: Secondary | ICD-10-CM | POA: Diagnosis not present

## 2019-03-23 MED ORDER — FEXOFENADINE HCL 180 MG PO TABS: 180.0000 mg | ORAL_TABLET | Freq: Every day | ORAL | 0 refills | Status: DC

## 2019-03-23 NOTE — Progress Notes (Signed)
Virtual Visit via Video Note  I connected with Andrew Simpson (pronounced New-yen) on April 07, 2019 at  4:30 PM EDT by a video enabled telemedicine application 2/2 DJSHF-02 pandemic and verified that I am speaking with the correct person using two identifiers.  Location patient: home Location provider:work or home office Persons participating in the virtual visit: patient, provider  I discussed the limitations of evaluation and management by telemedicine and the availability of in person appointments. The patient expressed understanding and agreed to proceed.   HPI: Pt is a 23 year old male with past medical history significant for vitamin B12 deficiency followed by Dr. Jerilee Hoh.  Pt seen for acute concern, pain in R side of neck x 2-3 days.  Notices more when he speaks.  Also endorses coughing since January. Was told he had allergies, but did not notice a difference in symptoms with claritin, zyrtec, or bendryl.  Denies edema of neck, pain with movement of head/neck, sore throat, fever, HA, rhinorrhea ear pain/pressure, facial pain/pressure nausea/vomiting, heartburn, changes in voice, sick contacts.  Endorses a tingling burn in chest that he associates with B12 def.  States eating bread, rice, chicken.   ROS: See pertinent positives and negatives per HPI.  Past Medical History:  Diagnosis Date  . B12 deficiency     No past surgical history on file.  Family History  Problem Relation Age of Onset  . Liver cancer Father        1-42  . Alcoholism Father     Current Outpatient Medications:  .  dicyclomine (BENTYL) 10 MG capsule, Take 1 capsule (10 mg total) by mouth every 8 (eight) hours as needed for spasms., Disp: 30 capsule, Rfl: 1  EXAM:  VITALS per patient if applicable: RR between 63-78 bpm  GENERAL: alert, oriented, appears well and in no acute distress  HEENT: atraumatic, conjunctiva clear, no obvious abnormalities on inspection of external nose and ears  NECK: No visible  deformities, edema, erythema, lesions.  Normal movements of the head and neck.  No pain with flexion, extension, and lateral movement of head/neck.  LUNGS: on inspection no signs of respiratory distress, breathing rate appears normal, no obvious gross SOB, gasping or wheezing  CV: no obvious cyanosis  MS: moves all visible extremities without noticeable abnormality  PSYCH/NEURO: pleasant and cooperative, no obvious depression or anxiety, speech and thought processing grossly intact  ASSESSMENT AND PLAN:  Discussed the following assessment and plan:  Sore neck -Discussed possible causes including muscle strain, lymphadenopathy, eustachian tube dysfunction, abscess -Discussed supportive care including Tylenol, gargling with warm salt water, heat, massage -Patient given precautions for continued or worsening symptoms  Cough  -Discussed possible causes including allergies, asthma, medications - Plan: fexofenadine (ALLEGRA ALLERGY) 180 MG tablet -Patient advised to consider consider nasal spray -Patient inquires about allergy testing.  Will follow up with PCP  Follow-up as needed PCP   I discussed the assessment and treatment plan with the patient. The patient was provided an opportunity to ask questions and all were answered. The patient agreed with the plan and demonstrated an understanding of the instructions.   The patient was advised to call back or seek an in-person evaluation if the symptoms worsen or if the condition fails to improve as anticipated.   Billie Ruddy, MD

## 2019-03-29 ENCOUNTER — Other Ambulatory Visit: Payer: Self-pay

## 2019-03-29 ENCOUNTER — Ambulatory Visit (AMBULATORY_SURGERY_CENTER): Payer: PRIVATE HEALTH INSURANCE | Admitting: *Deleted

## 2019-03-29 VITALS — Temp 97.9°F | Ht 64.0 in | Wt 185.0 lb

## 2019-03-29 DIAGNOSIS — R1031 Right lower quadrant pain: Secondary | ICD-10-CM

## 2019-03-29 DIAGNOSIS — R933 Abnormal findings on diagnostic imaging of other parts of digestive tract: Secondary | ICD-10-CM

## 2019-03-29 DIAGNOSIS — Z1159 Encounter for screening for other viral diseases: Secondary | ICD-10-CM

## 2019-03-29 HISTORY — PX: OTHER SURGICAL HISTORY: SHX169

## 2019-03-29 NOTE — Progress Notes (Signed)
No egg or soy allergy known to patient  No issues with past sedation with any surgeries  or procedures, no intubation problems  No diet pills per patient No home 02 use per patient  No blood thinners per patient  Pt denies issues with constipation  No A fib or A flutter  EMMI video sent to pt's e mail   Due to the COVID-19 pandemic we are asking patients to follow these guidelines. Please only bring one care partner. Please be aware that your care partner may wait in the car in the parking lot or if they feel like they will be too hot to wait in the car, they may wait in the lobby on the 4th floor. All care partners are required to wear a mask the entire time (we do not have any that we can provide them), they need to practice social distancing, and we will do a Covid check for all patient's and care partners when you arrive. Also we will check their temperature and your temperature. If the care partner waits in their car they need to stay in the parking lot the entire time and we will call them on their cell phone when the patient is ready for discharge so they can bring the car to the front of the building. Also all patient's will need to wear a mask into building.  SUPREP SAMPLE PROVIDED.  COVID SCREENING 04/09/19,1235

## 2019-04-03 ENCOUNTER — Encounter: Payer: Self-pay | Admitting: Gastroenterology

## 2019-04-09 ENCOUNTER — Other Ambulatory Visit: Payer: Self-pay | Admitting: Gastroenterology

## 2019-04-09 ENCOUNTER — Telehealth: Payer: Self-pay | Admitting: Gastroenterology

## 2019-04-09 NOTE — Telephone Encounter (Signed)
Pt is scheduled for a colon 04/12/19 and reported that he had eaten popcorn 04/08/19.  Does he need to reschedule?

## 2019-04-09 NOTE — Telephone Encounter (Signed)
Spoke with the patient. Explained that he does not have to reschedule but to follow the instructions from today to exam day. DO not eat any other foods on the list. Also I encouraged patient to drink plenty of fluids today and leading up to procedure to help flush out the popcorn. Pt verbalizes understanding. I reminded the patient of his covid screen test today-pt is aware! No further questions or concerns from the pt.

## 2019-04-10 LAB — SARS CORONAVIRUS 2 (TAT 6-24 HRS): SARS Coronavirus 2: NEGATIVE

## 2019-04-12 ENCOUNTER — Ambulatory Visit (AMBULATORY_SURGERY_CENTER): Payer: PRIVATE HEALTH INSURANCE | Admitting: Gastroenterology

## 2019-04-12 ENCOUNTER — Encounter: Payer: Self-pay | Admitting: Gastroenterology

## 2019-04-12 ENCOUNTER — Other Ambulatory Visit: Payer: Self-pay

## 2019-04-12 VITALS — BP 117/80 | HR 89 | Temp 98.8°F | Resp 17 | Ht 64.0 in | Wt 185.0 lb

## 2019-04-12 DIAGNOSIS — K5730 Diverticulosis of large intestine without perforation or abscess without bleeding: Secondary | ICD-10-CM | POA: Diagnosis not present

## 2019-04-12 DIAGNOSIS — R1031 Right lower quadrant pain: Secondary | ICD-10-CM

## 2019-04-12 DIAGNOSIS — R933 Abnormal findings on diagnostic imaging of other parts of digestive tract: Secondary | ICD-10-CM

## 2019-04-12 MED ORDER — SODIUM CHLORIDE 0.9 % IV SOLN: 500.0000 mL | Freq: Once | INTRAVENOUS | Status: DC

## 2019-04-12 NOTE — Op Note (Signed)
[facility] Patient Name: Andrew Simpson Procedure Date: 04/12/2019 10:59 AM MRN: [id number] Endoscopist: Sherilyn CooterHenry L. Myrtie Neitheranis , MD Age: [age] Referring MD:  Date of Birth: [birth year] Gender: Male Account #: [id number] Procedure:                Colonoscopy Indications:              Abdominal pain in the right lower quadrant                            (intermitent, years), Abnormal CT of the GI tract                            (suspected inflammation cecum/TI on CT scan 07/2018) Medicines:                Monitored Anesthesia Care Procedure:                Pre-Anesthesia Assessment:                           - Prior to the procedure, a History and Physical                            was performed, and patient medications and                            allergies were reviewed. The patient's tolerance of                            previous anesthesia was also reviewed. The risks                            and benefits of the procedure and the sedation                            options and risks were discussed with the patient.                            All questions were answered, and informed consent                            was obtained. Prior Anticoagulants: The patient has                            taken no previous anticoagulant or antiplatelet                            agents. ASA Grade Assessment: I - A normal, healthy                            patient. After reviewing the risks and benefits,                            the patient was deemed in satisfactory condition to  undergo the procedure.                           After obtaining informed consent, the colonoscope                            was passed under direct vision. Throughout the                            procedure, the patient's blood pressure, pulse, and                            oxygen saturations were monitored continuously. The                            Colonoscope was introduced  through the anus and                            advanced to the the terminal ileum, with                            identification of the appendiceal orifice and IC                            valve. The colonoscopy was performed without                            difficulty. The patient tolerated the procedure                            well. The quality of the bowel preparation was                            excellent. The terminal ileum, ileocecal valve,                            appendiceal orifice, and rectum were photographed. Scope In: 11:04:52 AM Scope Out: 11:12:36 AM Scope Withdrawal Time: 0 hours 6 minutes 45 seconds  Total Procedure Duration: 0 hours 7 minutes 44 seconds  Findings:                 The perianal and digital rectal examinations were                            normal.                           The terminal ileum appeared normal.                           Multiple diverticula were found in the proximal                            ascending colon.  The exam was otherwise without abnormality on                            direct and retroflexion views. Complications:            No immediate complications. Estimated Blood Loss:     Estimated blood loss: none. Impression:               - The examined portion of the ileum was normal.                           - Diverticulosis in the proximal ascending colon.                           - The examination was otherwise normal on direct                            and retroflexion views.                           - No specimens collected.                           No cause for pain seen on this exam. Patient                            recently prescribed a trial of dicyclomine to take                            during pain episodes. Recommendation:           - Patient has a contact number available for                            emergencies. The signs and symptoms of potential                             delayed complications were discussed with the                            patient. Return to normal activities tomorrow.                            Written discharge instructions were provided to the                            patient.                           - Resume previous diet.                           - Continue present medications.                           - No recommendation at this time regarding repeat  colonoscopy due to young age. Henry L. Myrtie Neither, MD 04/12/2019 11:20:44 AM This report has been signed electronically.

## 2019-04-12 NOTE — Progress Notes (Signed)
JB- Temp CW- Vitals 

## 2019-04-12 NOTE — Progress Notes (Signed)
Pt's states no medical or surgical changes since previsit or office visit. 

## 2019-04-12 NOTE — Patient Instructions (Signed)
Discharge instructions given. Normal exam. Resume previous medications. YOU HAD AN ENDOSCOPIC PROCEDURE TODAY AT THE [facility]:   Refer to the procedure report that was given to you for any specific questions about what was found during the examination.  If the procedure report does not answer your questions, please call your gastroenterologist to clarify.  If you requested that your care partner not be given the details of your procedure findings, then the procedure report has been included in a sealed envelope for you to review at your convenience later.  YOU SHOULD EXPECT: Some feelings of bloating in the abdomen. Passage of more gas than usual.  Walking can help get rid of the air that was put into your GI tract during the procedure and reduce the bloating. If you had a lower endoscopy (such as a colonoscopy or flexible sigmoidoscopy) you may notice spotting of blood in your stool or on the toilet paper. If you underwent a bowel prep for your procedure, you may not have a normal bowel movement for a few days.  Please Note:  You might notice some irritation and congestion in your nose or some drainage.  This is from the oxygen used during your procedure.  There is no need for concern and it should clear up in a day or so.  SYMPTOMS TO REPORT IMMEDIATELY:   Following lower endoscopy (colonoscopy or flexible sigmoidoscopy):  Excessive amounts of blood in the stool  Significant tenderness or worsening of abdominal pains  Swelling of the abdomen that is new, acute  Fever of 100F or higher   For urgent or emergent issues, a gastroenterologist can be reached at any hour by calling [phone].   DIET:  We do recommend a small meal at first, but then you may proceed to your regular diet.  Drink plenty of fluids but you should avoid alcoholic beverages for 24 hours.  ACTIVITY:  You should plan to take it easy for the rest of today and you should NOT DRIVE or use heavy machinery  until tomorrow (because of the sedation medicines used during the test).    FOLLOW UP: Our staff will call the number listed on your records 48-72 hours following your procedure to check on you and address any questions or concerns that you may have regarding the information given to you following your procedure. If we do not reach you, we will leave a message.  We will attempt to reach you two times.  During this call, we will ask if you have developed any symptoms of COVID 19. If you develop any symptoms (ie: fever, flu-like symptoms, shortness of breath, cough etc.) before then, please call [phone].  If you test positive for Covid 19 in the 2 weeks post procedure, please call and report this information to us.    If any biopsies were taken you will be contacted by phone or by letter within the next 1-3 weeks.  Please call us at [phone] if you have not heard about the biopsies in 3 weeks.    SIGNATURES/CONFIDENTIALITY: You and/or your care partner have signed paperwork which will be entered into your electronic medical record.  These signatures attest to the fact that that the information above on your After Visit Summary has been reviewed and is understood.  Full responsibility of the confidentiality of this discharge information lies with you and/or your care-partner. 

## 2019-04-12 NOTE — Progress Notes (Signed)
PT taken to PACU. Monitors in place. VSS. Report given to RN. 

## 2019-04-16 ENCOUNTER — Other Ambulatory Visit: Payer: Self-pay | Admitting: Family Medicine

## 2019-04-16 ENCOUNTER — Telehealth: Payer: Self-pay | Admitting: *Deleted

## 2019-04-16 DIAGNOSIS — R059 Cough, unspecified: Secondary | ICD-10-CM

## 2019-04-16 DIAGNOSIS — R05 Cough: Secondary | ICD-10-CM

## 2019-04-16 NOTE — Telephone Encounter (Signed)
Patient of Dr. Hernandez  

## 2019-04-16 NOTE — Telephone Encounter (Signed)
Left message on f/u call 

## 2019-04-16 NOTE — Telephone Encounter (Signed)
  Follow up Call-  Call back number 04/12/2019  Post procedure Call Back phone  # [phone]  Permission to leave phone message No     Patient questions:  Do you have a fever, pain , or abdominal swelling? No. Pain Score  0 *  Have you tolerated food without any problems? Yes.    Have you been able to return to your normal activities? Yes.    Do you have any questions about your discharge instructions: Diet   No. Medications  No. Follow up visit  No.  Do you have questions or concerns about your Care? No.  Actions: * If pain score is 4 or above: No action needed, pain <4.   1. Have you developed a fever since your procedure? no  2.   Have you had an respiratory symptoms (SOB or cough) since your procedure? no  3.   Have you tested positive for COVID 19 since your procedure no  4.   Have you had any family members/close contacts diagnosed with the COVID 19 since your procedure?  no   If yes to any of these questions please route to Joylene John, RN and Alphonsa Gin, Therapist, sports.

## 2019-04-20 ENCOUNTER — Ambulatory Visit (INDEPENDENT_AMBULATORY_CARE_PROVIDER_SITE_OTHER): Payer: PRIVATE HEALTH INSURANCE | Admitting: *Deleted

## 2019-04-20 ENCOUNTER — Other Ambulatory Visit: Payer: Self-pay

## 2019-04-20 DIAGNOSIS — E538 Deficiency of other specified B group vitamins: Secondary | ICD-10-CM | POA: Diagnosis not present

## 2019-04-20 MED ORDER — CYANOCOBALAMIN 1000 MCG/ML IJ SOLN
1000.0000 ug | Freq: Once | INTRAMUSCULAR | Status: AC
  Administered 2019-04-20: 1000 ug via INTRAMUSCULAR

## 2019-04-20 NOTE — Progress Notes (Signed)
Per orders of Dr. Hernandez, injection of B12 given by Larrisha Babineau. Patient tolerated injection well.  

## 2019-04-23 ENCOUNTER — Encounter: Payer: Self-pay | Admitting: Internal Medicine

## 2019-05-23 ENCOUNTER — Ambulatory Visit (INDEPENDENT_AMBULATORY_CARE_PROVIDER_SITE_OTHER): Payer: PRIVATE HEALTH INSURANCE | Admitting: *Deleted

## 2019-05-23 ENCOUNTER — Other Ambulatory Visit: Payer: Self-pay

## 2019-05-23 DIAGNOSIS — E538 Deficiency of other specified B group vitamins: Secondary | ICD-10-CM

## 2019-05-23 MED ORDER — CYANOCOBALAMIN 1000 MCG/ML IJ SOLN
1000.0000 ug | Freq: Once | INTRAMUSCULAR | Status: AC
  Administered 2019-05-23: 1000 ug via INTRAMUSCULAR

## 2019-05-23 NOTE — Progress Notes (Signed)
Per orders of Dr. Hernandez, injection of Vitamin B 12 given by [place] [facility] S [facility] Westrup. Patient tolerated injection well. 

## 2019-06-25 ENCOUNTER — Encounter: Payer: Self-pay | Admitting: Internal Medicine

## 2019-09-17 ENCOUNTER — Telehealth: Payer: Self-pay | Admitting: Internal Medicine

## 2019-09-17 NOTE — Telephone Encounter (Signed)
Pt sent a my-chart message to be scheduled for arm/neck pain and b12 inj. Due to pt being 24yrs old and [name] is his PCP but only seed 24 and up--Tanya will have to remove the modifier in order to schedule this pt when he calls back.   I left a VM for him to call back in order to schedule his appt he requested with [name].

## 2019-09-18 ENCOUNTER — Other Ambulatory Visit: Payer: Self-pay

## 2019-09-19 ENCOUNTER — Ambulatory Visit (INDEPENDENT_AMBULATORY_CARE_PROVIDER_SITE_OTHER): Payer: PRIVATE HEALTH INSURANCE | Admitting: Family Medicine

## 2019-09-19 ENCOUNTER — Encounter: Payer: Self-pay | Admitting: Family Medicine

## 2019-09-19 VITALS — BP 122/64 | HR 95 | Temp 97.9°F | Wt 183.2 lb

## 2019-09-19 DIAGNOSIS — E538 Deficiency of other specified B group vitamins: Secondary | ICD-10-CM

## 2019-09-19 DIAGNOSIS — M549 Dorsalgia, unspecified: Secondary | ICD-10-CM | POA: Diagnosis not present

## 2019-09-19 LAB — VITAMIN B12: Vitamin B-12: >1500 pg/mL — ABNORMAL HIGH (ref 211–911)

## 2019-09-19 MED ORDER — CYANOCOBALAMIN 1000 MCG/ML IJ SOLN
1000.0000 ug | Freq: Once | INTRAMUSCULAR | Status: AC
  Administered 2019-09-19: 1000 ug via INTRAMUSCULAR

## 2019-09-19 NOTE — Progress Notes (Signed)
Subjective:     Patient ID: Andrew Simpson, male   DOB: April 01, 1996, 24 y.o.   MRN: [id number]  HPI Andrew Simpson is seen today for the following issues  Left upper back pain.  He states it started about a week ago.  No injury.  Seems to be worse when he wakes up.  Radiates somewhat toward the left deltoid region.  No pain left shoulder.  Denies any lower cervical neck pain.  His pain is left trapezius area.  Spent some time on computer but no recent change in activities.  He works at [employer] and does some lifting.  He tried Advil but only took 200 mg.  Denies any upper extremity weakness or numbness.  No pain radiation beyond the proximal deltoid region.  Most of his pain is left trapezius area.  He has not tried any heat or ice     no prior history of neck difficulties.  History of B12 deficiency.  He had B12 low 138 last year and was started on B12 injections but has not had a now since December.  Not using any oral B12 supplementation.  He is not a vegetarian.  No history of gastric surgery.  Does not take any medications that would likely reduce B12 absorption.  Not aware of any family history of B12 deficiency.  Past Medical History:  Diagnosis Date  . Allergy   . B12 deficiency   . Hypertension    Past Surgical History:  Procedure Laterality Date  . NO PRIOR SURGERIES  03/29/2019    reports that he has never smoked. He has never used smokeless tobacco. He reports that he does not drink alcohol or use drugs. family history includes Alcoholism in his father; Liver cancer in his father. No Known Allergies '    Review of Systems  Constitutional: Negative for chills and fever.  Respiratory: Negative for cough and shortness of breath.   Cardiovascular: Negative for chest pain.  Musculoskeletal: Positive for back pain.  Skin: Negative for rash.  Neurological: Negative for weakness and numbness.  Hematological: Negative for adenopathy.       Objective:   Physical  Exam Vitals reviewed.  Constitutional:      Appearance: Normal appearance.  Cardiovascular:     Rate and Rhythm: Normal rate and regular rhythm.  Pulmonary:     Effort: Pulmonary effort is normal.     Breath sounds: Normal breath sounds.  Musculoskeletal:     Cervical back: Normal range of motion and neck supple.     Comments: Full range of motion left shoulder.  No pain with abduction against resistance.  He does have some tenderness along his left mid trapezius region.  Good range of motion neck.  Minimal pain with rotation lateral bending to the left  Lymphadenopathy:     Cervical: No cervical adenopathy.  Neurological:     Mental Status: He is alert.     Comments: Full strength upper extremities.  Symmetric reflexes.        Assessment:     #1 left upper back pain.  Suspect muscular.  Not clear this represents cervical radiculitis.  Nonfocal neuro exam.  #2 B12 deficiency.  He has gone several months without B12 injection.  No follow-up levels since initial low screen of 138 last year    Plan:     -Recheck B12 level -We recommend he follow-up with primary regarding his low B12.  He might benefit from trial of oral replacement with close follow-up -  We recommend over-the-counter Aleve or ibuprofen for his upper back pain.  Also suggested try heat or ice.  Also consider topical sports creams -Watch for any upper extremity numbness or weakness. -Follow-up with primary in 1 to 2 weeks if not improving  Kristian Covey MD [facility] at [facility]

## 2019-09-19 NOTE — Patient Instructions (Signed)
May take OTC Advil (up to 600-800 mg) every 8 hours with food- or Aleve twice daily  Try heat or ice to upper back  Consider muscle relaxer if pain persists.

## 2019-09-20 ENCOUNTER — Telehealth: Payer: Self-pay | Admitting: Internal Medicine

## 2019-09-20 NOTE — Telephone Encounter (Signed)
Spoke with patient and a follow up appointment with Dr [name] made.

## 2019-09-20 NOTE — Telephone Encounter (Signed)
The patient seen Dr. Caryl Never yesterday and they tried to contact him to tell him the results late yesterday afternoon. They left a message for the patient to contact the office.  The lab notes showed:  Andrew Covey, MD  09/19/2019 6:05 PM EDT    Follow-up B12 level is normal. I would suggest he start daily over-the-counter B12 1000 mcg and make sure he has follow-up with Dr. [name] within a few months to consider repeat level on oral replacement

## 2019-09-26 ENCOUNTER — Telehealth (INDEPENDENT_AMBULATORY_CARE_PROVIDER_SITE_OTHER): Payer: PRIVATE HEALTH INSURANCE | Admitting: Internal Medicine

## 2019-09-26 DIAGNOSIS — M79622 Pain in left upper arm: Secondary | ICD-10-CM | POA: Diagnosis not present

## 2019-09-26 MED ORDER — CYCLOBENZAPRINE HCL 5 MG PO TABS: 5.0000 mg | ORAL_TABLET | Freq: Two times a day (BID) | ORAL | 1 refills | Status: DC | PRN

## 2019-09-26 NOTE — Progress Notes (Signed)
Virtual Visit via Video Note  I connected with Amy Belloso on 09/26/19 at  3:30 PM EDT by a video enabled telemedicine application and verified that I am speaking with the correct person using two identifiers.  Location patient: home Location provider: work office Persons participating in the virtual visit: patient, provider  I discussed the limitations of evaluation and management by telemedicine and the availability of in person appointments. The patient expressed understanding and agreed to proceed.   HPI: He has scheduled this visit as a follow-up from his visit with Dr. Caryl Never from last week.  For about 2 weeks he has been having left upper arm pain.  He has full range of motion of his shoulder he has not had any redness or swelling, he has not had any recent injections in that arm.  He denies any fever.  He has had some intermittent sharp neck pain and some tingling down his left arm.  He has had to take a few days off work because of the pain.  He is wondering what he can do.   ROS: Constitutional: Denies fever, chills, diaphoresis, appetite change and fatigue.  HEENT: Denies photophobia, eye pain, redness, hearing loss, ear pain, congestion, sore throat, rhinorrhea, sneezing, mouth sores, trouble swallowing, neck pain, neck stiffness and tinnitus.   Respiratory: Denies SOB, DOE, cough, chest tightness,  and wheezing.   Cardiovascular: Denies chest pain, palpitations and leg swelling.  Gastrointestinal: Denies nausea, vomiting, abdominal pain, diarrhea, constipation, blood in stool and abdominal distention.  Genitourinary: Denies dysuria, urgency, frequency, hematuria, flank pain and difficulty urinating.  Endocrine: Denies: hot or cold intolerance, sweats, changes in hair or nails, polyuria, polydipsia. Musculoskeletal: Denies  back pain, joint swelling, arthralgias and gait problem.  Skin: Denies pallor, rash and wound.  Neurological: Denies dizziness, seizures,  syncope, weakness, light-headedness, numbness and headaches.  Hematological: Denies adenopathy. Easy bruising, personal or family bleeding history  Psychiatric/Behavioral: Denies suicidal ideation, mood changes, confusion, nervousness, sleep disturbance and agitation   Past Medical History:  Diagnosis Date  . Allergy   . B12 deficiency   . Hypertension     Past Surgical History:  Procedure Laterality Date  . NO PRIOR SURGERIES  03/29/2019    Family History  Problem Relation Age of Onset  . Liver cancer Father        32-42  . Alcoholism Father   . Colon cancer Neg Hx   . Colon polyps Neg Hx   . Esophageal cancer Neg Hx   . Rectal cancer Neg Hx   . Stomach cancer Neg Hx     SOCIAL HX:   reports that he has never smoked. He has never used smokeless tobacco. He reports that he does not drink alcohol or use drugs.   Current Outpatient Medications:  .  cyclobenzaprine (FLEXERIL) 5 MG tablet, Take 1 tablet (5 mg total) by mouth 2 (two) times daily as needed for muscle spasms., Disp: 30 tablet, Rfl: 1  EXAM:   VITALS per patient if applicable: None reported  GENERAL: alert, oriented, appears well and in no acute distress  HEENT: atraumatic, conjunttiva clear, no obvious abnormalities on inspection of external nose and ears  NECK: normal movements of the head and neck  LUNGS: on inspection no signs of respiratory distress, breathing rate appears normal, no obvious gross increased work of breathing, gasping or wheezing  CV: no obvious cyanosis  MS: moves all visible extremities without noticeable abnormality he has demonstrated full range of motion  of his shoulder on camera  PSYCH/NEURO: pleasant and cooperative, no obvious depression or anxiety, speech and thought processing grossly intact  ASSESSMENT AND PLAN:   Left upper arm pain  -Etiology unclear back to musculoskeletal.  I have advised icing, NSAIDs, local massage therapy, will give some muscle relaxers. -If  this continues, can consider cervical spine MRI given his neck pain and mild tingling in that area. -I will write him off from work until next week.    I discussed the assessment and treatment plan with the patient. The patient was provided an opportunity to ask questions and all were answered. The patient agreed with the plan and demonstrated an understanding of the instructions.   The patient was advised to call back or seek an in-person evaluation if the symptoms worsen or if the condition fails to improve as anticipated.    Lelon Frohlich, MD  [facility] at [facility]

## 2019-10-03 ENCOUNTER — Encounter: Payer: Self-pay | Admitting: Internal Medicine

## 2019-10-19 ENCOUNTER — Other Ambulatory Visit: Payer: Self-pay

## 2019-10-19 ENCOUNTER — Ambulatory Visit (INDEPENDENT_AMBULATORY_CARE_PROVIDER_SITE_OTHER): Payer: Self-pay | Admitting: *Deleted

## 2019-10-19 DIAGNOSIS — E538 Deficiency of other specified B group vitamins: Secondary | ICD-10-CM

## 2019-10-19 MED ORDER — CYANOCOBALAMIN 1000 MCG/ML IJ SOLN
1000.0000 ug | Freq: Once | INTRAMUSCULAR | Status: AC
  Administered 2019-10-19: 1000 ug via INTRAMUSCULAR

## 2019-10-19 NOTE — Progress Notes (Signed)
Patient in office today for B-12 injection. Injection administered with no immediate reaction 

## 2019-12-12 ENCOUNTER — Encounter: Payer: Self-pay | Admitting: Internal Medicine

## 2020-02-05 ENCOUNTER — Ambulatory Visit: Payer: PRIVATE HEALTH INSURANCE | Admitting: Internal Medicine

## 2020-02-11 ENCOUNTER — Encounter: Payer: Self-pay | Admitting: Internal Medicine

## 2020-02-19 ENCOUNTER — Ambulatory Visit: Payer: PRIVATE HEALTH INSURANCE | Admitting: Internal Medicine

## 2020-02-21 ENCOUNTER — Other Ambulatory Visit: Payer: Self-pay | Admitting: Internal Medicine

## 2020-02-21 ENCOUNTER — Ambulatory Visit (INDEPENDENT_AMBULATORY_CARE_PROVIDER_SITE_OTHER): Payer: PRIVATE HEALTH INSURANCE | Admitting: Internal Medicine

## 2020-02-21 ENCOUNTER — Other Ambulatory Visit: Payer: Self-pay

## 2020-02-21 VITALS — BP 124/76 | HR 100 | Temp 98.1°F | Wt 181.6 lb

## 2020-02-21 DIAGNOSIS — T1591XA Foreign body on external eye, part unspecified, right eye, initial encounter: Secondary | ICD-10-CM | POA: Diagnosis not present

## 2020-02-21 DIAGNOSIS — E669 Obesity, unspecified: Secondary | ICD-10-CM

## 2020-02-21 DIAGNOSIS — Z23 Encounter for immunization: Secondary | ICD-10-CM

## 2020-02-21 DIAGNOSIS — E538 Deficiency of other specified B group vitamins: Secondary | ICD-10-CM | POA: Diagnosis not present

## 2020-02-21 NOTE — Progress Notes (Signed)
Established Patient Office Visit     This visit occurred during the SARS-CoV-2 public health emergency.  Safety protocols were in place, including screening questions prior to the visit, additional usage of staff PPE, and extensive cleaning of exam room while observing appropriate contact time as indicated for disinfecting solutions.    CC/Reason for Visit: Discuss some acute concerns  HPI: Andrew Simpson is a 24 y.o. male who is coming in today for the above mentioned reasons.  1.  He would like to have his vitamin B12 levels checked as he has been on monthly IM injections now for close to a year and he would like to go to oral supplementation.  2.  For the past 5 days he has had irritation of his right eye.  He has tried natural tears without relief.  3.  He wants to know if it is okay for him to donate blood with his history of B12 deficiency.  4.  Requesting flu vaccination.   Past Medical/Surgical History: Past Medical History:  Diagnosis Date  . Allergy   . B12 deficiency   . Hypertension     Past Surgical History:  Procedure Laterality Date  . NO PRIOR SURGERIES  03/29/2019    Social History:  reports that he has never smoked. He has never used smokeless tobacco. He reports that he does not drink alcohol and does not use drugs.  Allergies: No Known Allergies  Family History:  Family History  Problem Relation Age of Onset  . Liver cancer Father        21-42  . Alcoholism Father   . Colon cancer Neg Hx   . Colon polyps Neg Hx   . Esophageal cancer Neg Hx   . Rectal cancer Neg Hx   . Stomach cancer Neg Hx     No current outpatient medications on file.  Review of Systems:  Constitutional: Denies fever, chills, diaphoresis, appetite change and fatigue.  HEENT: Denies photophobia, redness, hearing loss, ear pain, congestion, sore throat, rhinorrhea, sneezing, mouth sores, trouble swallowing, neck pain, neck stiffness and tinnitus.   Respiratory:  Denies SOB, DOE, cough, chest tightness,  and wheezing.   Cardiovascular: Denies chest pain, palpitations and leg swelling.  Gastrointestinal: Denies nausea, vomiting, abdominal pain, diarrhea, constipation, blood in stool and abdominal distention.  Genitourinary: Denies dysuria, urgency, frequency, hematuria, flank pain and difficulty urinating.  Endocrine: Denies: hot or cold intolerance, sweats, changes in hair or nails, polyuria, polydipsia. Musculoskeletal: Denies myalgias, back pain, joint swelling, arthralgias and gait problem.  Skin: Denies pallor, rash and wound.  Neurological: Denies dizziness, seizures, syncope, weakness, light-headedness, numbness and headaches.  Hematological: Denies adenopathy. Easy bruising, personal or family bleeding history  Psychiatric/Behavioral: Denies suicidal ideation, mood changes, confusion, nervousness, sleep disturbance and agitation    Physical Exam: Vitals:   02/21/20 1430  BP: 124/76  Pulse: 100  Temp: 98.1 F (36.7 C)  SpO2: 99%  Weight: 181 lb 9.6 oz (82.4 kg)    Body mass index is 31.17 kg/m.   Constitutional: NAD, calm, comfortable Eyes: PERRL, lids and conjunctivae normal, conjunctival and scleral erythema with 2 eyelashes in the lower conjunctiva, wears corrective lenses ENMT: Mucous membranes are moist. Respiratory: clear to auscultation bilaterally, no wheezing, no crackles. Normal respiratory effort. No accessory muscle use.  Cardiovascular: Regular rate and rhythm, no murmurs / rubs / gallops. No extremity edema. 2+ pedal pulses. No carotid bruits.  Neurologic: Grossly intact and nonfocal Psychiatric: Normal judgment and insight.  Alert and oriented x 3. Normal mood.    Impression and Plan:  Foreign body of right eye, initial encounter -Eyelashes removed, advised that he may continue to use natural tears as needed.  B12 deficiency -Per request, check levels today, if levels are acceptable he would like to try oral  supplements instead.  Need for influenza vaccination -Flu vaccine administered today.  Obesity (BMI 30.0-34.9) -Discussed healthy lifestyle, including increased physical activity and better food choices to promote weight loss.     Chaya Jan, MD [facility] at [facility]

## 2020-02-21 NOTE — Addendum Note (Signed)
Addended by: Kern Reap B on: 02/21/2020 02:57 PM   Modules accepted: Orders

## 2020-02-22 LAB — VITAMIN B12: Vitamin B-12: 295 pg/mL (ref 200–1100)

## 2020-03-13 ENCOUNTER — Ambulatory Visit: Payer: PRIVATE HEALTH INSURANCE

## 2020-03-13 ENCOUNTER — Other Ambulatory Visit: Payer: Self-pay

## 2020-03-13 DIAGNOSIS — E538 Deficiency of other specified B group vitamins: Secondary | ICD-10-CM

## 2020-03-13 MED ORDER — CYANOCOBALAMIN 1000 MCG/ML IJ SOLN
1000.0000 ug | Freq: Once | INTRAMUSCULAR | Status: AC
  Administered 2020-03-13: 1000 ug via INTRAMUSCULAR

## 2020-04-17 ENCOUNTER — Ambulatory Visit (INDEPENDENT_AMBULATORY_CARE_PROVIDER_SITE_OTHER): Payer: PRIVATE HEALTH INSURANCE | Admitting: Internal Medicine

## 2020-04-17 ENCOUNTER — Other Ambulatory Visit: Payer: Self-pay

## 2020-04-17 ENCOUNTER — Encounter: Payer: Self-pay | Admitting: Internal Medicine

## 2020-04-17 VITALS — BP 122/80 | HR 77 | Temp 98.1°F | Ht 64.0 in | Wt 187.5 lb

## 2020-04-17 DIAGNOSIS — L210 Seborrhea capitis: Secondary | ICD-10-CM

## 2020-04-17 DIAGNOSIS — E538 Deficiency of other specified B group vitamins: Secondary | ICD-10-CM

## 2020-04-17 DIAGNOSIS — H5789 Other specified disorders of eye and adnexa: Secondary | ICD-10-CM | POA: Diagnosis not present

## 2020-04-17 NOTE — Progress Notes (Signed)
Established Patient Office Visit     This visit occurred during the SARS-CoV-2 public health emergency.  Safety protocols were in place, including screening questions prior to the visit, additional usage of staff PPE, and extensive cleaning of exam room while observing appropriate contact time as indicated for disinfecting solutions.    CC/Reason for Visit: Discuss a couple acute concerns and chronic conditions  HPI: Andrew Simpson is a 24 y.o. male who is coming in today for the above mentioned reasons. Past Medical History is significant for: Vitamin B12 deficiency.  He is here for his monthly IM B12 today.  He has also been complaining of watery, itchy, red eyes now for about 6 weeks.  He has been using Claritin daily, he is not a contact lens wearer.  He has also been noticing a dry, flaky scalp.   Past Medical/Surgical History: Past Medical History:  Diagnosis Date  . Allergy   . B12 deficiency   . Hypertension     Past Surgical History:  Procedure Laterality Date  . NO PRIOR SURGERIES  03/29/2019    Social History:  reports that he has never smoked. He has never used smokeless tobacco. He reports that he does not drink alcohol and does not use drugs.  Allergies: No Known Allergies  Family History:  Family History  Problem Relation Age of Onset  . Liver cancer Father        56-42  . Alcoholism Father   . Colon cancer Neg Hx   . Colon polyps Neg Hx   . Esophageal cancer Neg Hx   . Rectal cancer Neg Hx   . Stomach cancer Neg Hx      Current Outpatient Medications:  .  loratadine (CLARITIN) 10 MG tablet, Take 10 mg by mouth daily., Disp: , Rfl:   Review of Systems:  Constitutional: Denies fever, chills, diaphoresis, appetite change and fatigue.  HEENT: Denies photophobia, eye pain, hearing loss, ear pain, congestion, sore throat, rhinorrhea, sneezing, mouth sores, trouble swallowing, neck pain, neck stiffness and tinnitus.   Respiratory: Denies SOB,  DOE, cough, chest tightness,  and wheezing.   Cardiovascular: Denies chest pain, palpitations and leg swelling.  Gastrointestinal: Denies nausea, vomiting, abdominal pain, diarrhea, constipation, blood in stool and abdominal distention.  Genitourinary: Denies dysuria, urgency, frequency, hematuria, flank pain and difficulty urinating.  Endocrine: Denies: hot or cold intolerance, sweats, changes in hair or nails, polyuria, polydipsia. Musculoskeletal: Denies myalgias, back pain, joint swelling, arthralgias and gait problem.  Skin: Denies pallor, rash and wound.  Neurological: Denies dizziness, seizures, syncope, weakness, light-headedness, numbness and headaches.  Hematological: Denies adenopathy. Easy bruising, personal or family bleeding history  Psychiatric/Behavioral: Denies suicidal ideation, mood changes, confusion, nervousness, sleep disturbance and agitation    Physical Exam: Vitals:   04/17/20 1003  BP: 122/80  Pulse: 77  Temp: 98.1 F (36.7 C)  TempSrc: Oral  SpO2: 98%  Weight: 187 lb 8 oz (85 kg)  Height: 5\' 4"  (1.626 m)    Body mass index is 32.18 kg/m.   Constitutional: NAD, calm, comfortable Eyes: PERRL, he has conjunctival injection. ENMT: Mucous membranes are moist. Respiratory: clear to auscultation bilaterally, no wheezing, no crackles. Normal respiratory effort. No accessory muscle use.  Cardiovascular: Regular rate and rhythm, no murmurs / rubs / gallops. No extremity edema. Scalp: Mild dandruff/flaky scalp noted. Neurologic: Grossly intact and nonfocal Psychiatric: Normal judgment and insight. Alert and oriented x 3. Normal mood.    Impression and Plan:  B12  deficiency -Monthly B12 injection today.  Flaky scalp -Mild case of dandruff noted. -Advised to go over his scalp gently with a fine tooth comb to loosen the flakes, then he is advised to use Selsun Blue shampoo daily.  Notify us if no improvement.  Itchy, watery, and red eye -Appears to be  allergic conjunctivitis, do not believe. -He will continue to use a daily antihistamine, have also advised olopatadine eyedrops daily. -He will contact us if no improvement.     Chaya Jan, MD [facility] at [facility]

## 2020-06-10 ENCOUNTER — Ambulatory Visit (INDEPENDENT_AMBULATORY_CARE_PROVIDER_SITE_OTHER): Payer: PRIVATE HEALTH INSURANCE | Admitting: *Deleted

## 2020-06-10 ENCOUNTER — Other Ambulatory Visit: Payer: Self-pay

## 2020-06-10 DIAGNOSIS — E538 Deficiency of other specified B group vitamins: Secondary | ICD-10-CM | POA: Diagnosis not present

## 2020-06-10 MED ORDER — CYANOCOBALAMIN 1000 MCG/ML IJ SOLN
1000.0000 ug | Freq: Once | INTRAMUSCULAR | Status: AC
  Administered 2020-06-10: 1000 ug via INTRAMUSCULAR

## 2020-06-10 NOTE — Progress Notes (Signed)
Per orders of Dr. Hernandez, injection of B12 given by [place]. Patient tolerated injection well.  

## 2020-06-30 ENCOUNTER — Encounter: Payer: Self-pay | Admitting: Internal Medicine

## 2020-07-20 ENCOUNTER — Emergency Department (HOSPITAL_COMMUNITY): Payer: Self-pay

## 2020-07-20 ENCOUNTER — Other Ambulatory Visit: Payer: Self-pay

## 2020-07-20 ENCOUNTER — Emergency Department (HOSPITAL_COMMUNITY)
Admission: EM | Admit: 2020-07-20 | Discharge: 2020-07-20 | Disposition: A | Discharge status: Home / Self Care | Payer: Self-pay | Attending: Emergency Medicine | Admitting: Emergency Medicine

## 2020-07-20 ENCOUNTER — Encounter: Payer: Self-pay | Admitting: Internal Medicine

## 2020-07-20 DIAGNOSIS — I10 Essential (primary) hypertension: Secondary | ICD-10-CM | POA: Insufficient documentation

## 2020-07-20 DIAGNOSIS — N23 Unspecified renal colic: Secondary | ICD-10-CM | POA: Insufficient documentation

## 2020-07-20 LAB — URINALYSIS, ROUTINE W REFLEX MICROSCOPIC
Bilirubin Urine: NEGATIVE
Glucose, UA: NEGATIVE mg/dL
Ketones, ur: 5 mg/dL — AB
Leukocytes,Ua: NEGATIVE
Nitrite: NEGATIVE
Protein, ur: NEGATIVE mg/dL
RBC / HPF: >50 RBC/hpf — ABNORMAL HIGH (ref 0–5)
Specific Gravity, Urine: 1.009 (ref 1.005–1.030)
pH: 6 (ref 5.0–8.0)

## 2020-07-20 LAB — COMPREHENSIVE METABOLIC PANEL
ALT: 39 U/L (ref 0–44)
AST: 38 U/L (ref 15–41)
Albumin: 4.2 g/dL (ref 3.5–5.0)
Alkaline Phosphatase: 114 U/L (ref 38–126)
Anion gap: 13 (ref 5–15)
BUN: 10 mg/dL (ref 6–20)
CO2: 21 mmol/L — ABNORMAL LOW (ref 22–32)
Calcium: 8.6 mg/dL — ABNORMAL LOW (ref 8.9–10.3)
Chloride: 106 mmol/L (ref 98–111)
Creatinine, Ser: 1.3 mg/dL — ABNORMAL HIGH (ref 0.61–1.24)
GFR, Estimated: >60 mL/min (ref >60)
Glucose, Bld: 117 mg/dL — ABNORMAL HIGH (ref 70–99)
Potassium: 3.5 mmol/L (ref 3.5–5.1)
Sodium: 140 mmol/L (ref 135–145)
Total Bilirubin: 1.5 mg/dL — ABNORMAL HIGH (ref 0.3–1.2)
Total Protein: 7.5 g/dL (ref 6.5–8.1)

## 2020-07-20 LAB — CBC
HCT: 47.5 % (ref 39.0–52.0)
Hemoglobin: 16 g/dL (ref 13.0–17.0)
MCH: 30.4 pg (ref 26.0–34.0)
MCHC: 33.7 g/dL (ref 30.0–36.0)
MCV: 90.1 fL (ref 80.0–100.0)
Platelets: 308 10*3/uL (ref 150–400)
RBC: 5.27 MIL/uL (ref 4.22–5.81)
RDW: 12.4 % (ref 11.5–15.5)
WBC: 7.5 10*3/uL (ref 4.0–10.5)
nRBC: 0 % (ref 0.0–0.2)

## 2020-07-20 MED ORDER — SODIUM CHLORIDE 0.9 % IV BOLUS
1000.0000 mL | Freq: Once | INTRAVENOUS | Status: AC
  Administered 2020-07-20: 1000 mL via INTRAVENOUS

## 2020-07-20 MED ORDER — FENTANYL CITRATE (PF) 100 MCG/2ML IJ SOLN
100.0000 ug | Freq: Once | INTRAMUSCULAR | Status: AC
  Administered 2020-07-20: 100 ug via INTRAVENOUS
  Filled 2020-07-20: qty 2

## 2020-07-20 MED ORDER — ONDANSETRON HCL 4 MG/2ML IJ SOLN
4.0000 mg | Freq: Once | INTRAMUSCULAR | Status: AC
  Administered 2020-07-20: 4 mg via INTRAVENOUS
  Filled 2020-07-20: qty 2

## 2020-07-20 NOTE — ED Notes (Signed)
Notified pt of need for urine sample. Pt unable to urinate at this time

## 2020-07-20 NOTE — ED Notes (Signed)
Provided pt w/labeled specimen cup for urine collection. ENMiles 

## 2020-07-20 NOTE — ED Provider Notes (Signed)
[facility]-EMERGENCY DEPT Provider Note: Lowella Dell, MD, FACEP  CSN: [id number] MRN: [id number] ARRIVAL: 07/20/20 at 0128 ROOM: PO24/MP53   CHIEF COMPLAINT  Flank Pain   HISTORY OF PRESENT ILLNESS  07/20/20 2:23 AM Andrew Simpson is a 25 y.o. male with left flank pain that began about 45 minutes prior to arrival.  The pain woke him out of sleep.  He rates the pain as a 9 out of 10.  He has never had similar pain in the past.  Nothing makes it better or worse.  He also feels dehydrated by which she means his throat feels dry and irritated when he swallows.  He was able to urinate at home, and noted no hematuria, but has not been able to urinate while in the ED.   Past Medical History:  Diagnosis Date  . Allergy   . B12 deficiency   . Hypertension     Past Surgical History:  Procedure Laterality Date  . NO PRIOR SURGERIES  03/29/2019    Family History  Problem Relation Age of Onset  . Liver cancer Father        84-42  . Alcoholism Father   . Colon cancer Neg Hx   . Colon polyps Neg Hx   . Esophageal cancer Neg Hx   . Rectal cancer Neg Hx   . Stomach cancer Neg Hx     Social History   Tobacco Use  . Smoking status: Never Smoker  . Smokeless tobacco: Never Used  Vaping Use  . Vaping Use: Never used  Substance Use Topics  . Alcohol use: Never  . Drug use: Never    Prior to Admission medications   Medication Sig Start Date End Date Taking? Authorizing Provider  guaiFENesin (ROBITUSSIN) 100 MG/5ML liquid Take 200 mg by mouth 3 (three) times daily as needed for cough or congestion.   Yes [provider]  loratadine (CLARITIN) 10 MG tablet Take 10 mg by mouth daily as needed for allergies.   Yes [provider]    Allergies Lactose intolerance (gi)   REVIEW OF SYSTEMS  Negative except as noted here or in the History of Present Illness.   PHYSICAL EXAMINATION  Initial Vital Signs Blood pressure 101/79, pulse 89, temperature 97.6 F (36.4  C), temperature source Oral, resp. rate 18, height 5\' 4"  (1.626 m), weight 81.6 kg, SpO2 100 %.  Examination General: Well-developed, well-nourished male in no acute distress; appearance consistent with age of record HENT: normocephalic; atraumatic Eyes: pupils equal, round and reactive to light; extraocular muscles intact Neck: supple Heart: regular rate and rhythm Lungs: clear to auscultation bilaterally Abdomen: soft; nondistended; nontender; bowel sounds present GU: No CVA tenderness Extremities: No deformity; full range of motion; pulses normal Neurologic: Awake, alert and oriented; motor function intact in all extremities and symmetric; no facial droop Skin: Warm and dry Psychiatric: Normal mood and affect   RESULTS  Summary of this visit's results, reviewed and interpreted by myself:   EKG Interpretation  Date/Time:    Ventricular Rate:    PR Interval:    QRS Duration:   QT Interval:    QTC Calculation:   R Axis:     Text Interpretation:        Laboratory Studies: Results for orders placed or performed during the hospital encounter of 07/20/20 (from the past 24 hour(s))  CBC     Status: None   Collection Time: 07/20/20  1:47 AM  Result Value Ref Range   WBC  7.5 4.0 - 10.5 K/uL   RBC 5.27 4.22 - 5.81 MIL/uL   Hemoglobin 16.0 13.0 - 17.0 g/dL   HCT 31.5 17.6 - 16.0 %   MCV 90.1 80.0 - 100.0 fL   MCH 30.4 26.0 - 34.0 pg   MCHC 33.7 30.0 - 36.0 g/dL   RDW 73.7 10.6 - 26.9 %   Platelets 308 150 - 400 K/uL   nRBC 0.0 0.0 - 0.2 %  Comprehensive metabolic panel     Status: Abnormal   Collection Time: 07/20/20  1:47 AM  Result Value Ref Range   Sodium 140 135 - 145 mmol/L   Potassium 3.5 3.5 - 5.1 mmol/L   Chloride 106 98 - 111 mmol/L   CO2 21 (L) 22 - 32 mmol/L   Glucose, Bld 117 (H) 70 - 99 mg/dL   BUN 10 6 - 20 mg/dL   Creatinine, Ser 4.85 (H) 0.61 - 1.24 mg/dL   Calcium 8.6 (L) 8.9 - 10.3 mg/dL   Total Protein 7.5 6.5 - 8.1 g/dL   Albumin 4.2 3.5 - 5.0  g/dL   AST 38 15 - 41 U/L   ALT 39 0 - 44 U/L   Alkaline Phosphatase 114 38 - 126 U/L   Total Bilirubin 1.5 (H) 0.3 - 1.2 mg/dL   GFR, Estimated >46 >27 mL/min   Anion gap 13 5 - 15  Urinalysis, Routine w reflex microscopic Urine, Clean Catch     Status: Abnormal   Collection Time: 07/20/20  1:47 AM  Result Value Ref Range   Color, Urine YELLOW YELLOW   APPearance CLEAR CLEAR   Specific Gravity, Urine 1.009 1.005 - 1.030   pH 6.0 5.0 - 8.0   Glucose, UA NEGATIVE NEGATIVE mg/dL   Hgb urine dipstick LARGE (A) NEGATIVE   Bilirubin Urine NEGATIVE NEGATIVE   Ketones, ur 5 (A) NEGATIVE mg/dL   Protein, ur NEGATIVE NEGATIVE mg/dL   Nitrite NEGATIVE NEGATIVE   Leukocytes,Ua NEGATIVE NEGATIVE   RBC / HPF >50 (H) 0 - 5 RBC/hpf   WBC, UA 6-10 0 - 5 WBC/hpf   Bacteria, UA RARE (A) NONE SEEN   Mucus PRESENT    Imaging Studies: CT Renal Stone Study  Result Date: 07/20/2020 CLINICAL DATA:  Left-sided flank pain of acute onset. EXAM: CT ABDOMEN AND PELVIS WITHOUT CONTRAST TECHNIQUE: Multidetector CT imaging of the abdomen and pelvis was performed following the standard protocol without IV contrast. COMPARISON:  08/23/2018. FINDINGS: Lower chest: Normal Hepatobiliary: Normal Pancreas: Normal Spleen: Normal Adrenals/Urinary Tract: Adrenal glands are normal. Left kidney is normal. Right kidney contains a 2 mm nonobstructing stone in the midportion. No hydroureteronephrosis. No visible passing stone. No stone in the bladder. No stones visible in the urethra. Stomach/Bowel: Normal.  Appendix is normal. Vascular/Lymphatic: Normal Reproductive: Normal Other: No free fluid or air. Musculoskeletal: Normal.  No hernia. IMPRESSION: 2 mm nonobstructing stone in the midportion of the right kidney. No hydroureteronephrosis. No visible passing stone. No stone in the bladder. Electronically Signed   By: Paulina Fusi M.D.   On: 07/20/2020 03:07    ED COURSE and MDM  Nursing notes, initial and subsequent vitals  signs, including pulse oximetry, reviewed and interpreted by myself.  Vitals:   07/20/20 0139 07/20/20 0141 07/20/20 0513  BP: 101/79  128/86  Pulse: 89  76  Resp: 18  16  Temp: 97.6 F (36.4 C)    TempSrc: Oral    SpO2: 100%  100%  Weight:  81.6 kg  Height:  5\' 4"  (1.626 m)    Medications  sodium chloride 0.9 % bolus 1,000 mL (1,000 mLs Intravenous New Bag/Given 07/20/20 0242)  ondansetron (ZOFRAN) injection 4 mg (4 mg Intravenous Given 07/20/20 0242)  fentaNYL (SUBLIMAZE) injection 100 mcg (100 mcg Intravenous Given 07/20/20 0241)   5:18 AM Patient's pain completely resolved.  Given his hematuria and symptoms but negative CT I suspect he passed a stone.   PROCEDURES  Procedures   ED DIAGNOSES     ICD-10-CM   1. Ureteral colic  N23        Janye Maynor, MD 07/20/20 [phone]

## 2020-07-20 NOTE — ED Triage Notes (Signed)
Pt came from home with L sided flank pain that started approx 45 min ago. Pt states it woke him up out of sleep. Pt states he also feels dehydrated. He took Mucinex for a sore throat but nothing for pain.

## 2020-08-01 ENCOUNTER — Telehealth: Payer: Self-pay | Admitting: Internal Medicine

## 2020-08-01 NOTE — Telephone Encounter (Signed)
Pt is calling in wanting to have his B-12 checked and to let us know that he had went to psych and they told him to ask his PCP to have labs done due to him being very fatigue.  Pt would like to have a call back to get set up for labs.

## 2020-08-01 NOTE — Telephone Encounter (Signed)
Attempted to call patient, but unable to leave a message.  Okay for patient to schedule a B12 injection.  Message sent via [place],

## 2020-08-12 ENCOUNTER — Other Ambulatory Visit: Payer: Self-pay

## 2020-08-12 ENCOUNTER — Ambulatory Visit (INDEPENDENT_AMBULATORY_CARE_PROVIDER_SITE_OTHER): Payer: PRIVATE HEALTH INSURANCE | Admitting: *Deleted

## 2020-08-12 DIAGNOSIS — E538 Deficiency of other specified B group vitamins: Secondary | ICD-10-CM | POA: Diagnosis not present

## 2020-08-12 MED ORDER — CYANOCOBALAMIN 1000 MCG/ML IJ SOLN
1000.0000 ug | Freq: Once | INTRAMUSCULAR | Status: AC
  Administered 2020-08-12: 1000 ug via INTRAMUSCULAR

## 2020-08-12 NOTE — Progress Notes (Signed)
Per orders of Dr. Hernandez, injection of Cyanocobalamin 1000mcg given by Funderburk, Jo A. Patient tolerated injection well. 

## 2020-10-13 ENCOUNTER — Encounter: Payer: Self-pay | Admitting: Internal Medicine

## 2020-10-14 MED ORDER — SERTRALINE HCL 50 MG PO TABS: 1.0000 | ORAL_TABLET | Freq: Every day | ORAL | 1 refills | Status: DC

## 2020-11-30 ENCOUNTER — Encounter: Payer: Self-pay | Admitting: Internal Medicine

## 2020-12-02 ENCOUNTER — Encounter: Payer: Self-pay | Admitting: Internal Medicine

## 2020-12-25 ENCOUNTER — Other Ambulatory Visit: Payer: Self-pay

## 2020-12-25 ENCOUNTER — Ambulatory Visit (INDEPENDENT_AMBULATORY_CARE_PROVIDER_SITE_OTHER): Payer: Self-pay | Admitting: *Deleted

## 2020-12-25 DIAGNOSIS — E538 Deficiency of other specified B group vitamins: Secondary | ICD-10-CM

## 2020-12-25 MED ORDER — CYANOCOBALAMIN 1000 MCG/ML IJ SOLN
1000.0000 ug | Freq: Once | INTRAMUSCULAR | Status: AC
  Administered 2020-12-25: 1000 ug via INTRAMUSCULAR

## 2020-12-25 NOTE — Progress Notes (Signed)
Per orders of Dr. Hernandez, injection of b12 given by Hy Swiatek. Patient tolerated injection well.  

## 2021-04-14 ENCOUNTER — Encounter: Payer: Self-pay | Admitting: Internal Medicine

## 2021-04-16 ENCOUNTER — Ambulatory Visit (INDEPENDENT_AMBULATORY_CARE_PROVIDER_SITE_OTHER): Payer: PRIVATE HEALTH INSURANCE | Admitting: Internal Medicine

## 2021-04-16 ENCOUNTER — Encounter: Payer: Self-pay | Admitting: Internal Medicine

## 2021-04-16 VITALS — BP 128/82 | HR 52 | Temp 98.4°F | Ht 64.0 in | Wt 186.7 lb

## 2021-04-16 DIAGNOSIS — H539 Unspecified visual disturbance: Secondary | ICD-10-CM

## 2021-04-16 DIAGNOSIS — Z23 Encounter for immunization: Secondary | ICD-10-CM

## 2021-04-16 DIAGNOSIS — J302 Other seasonal allergic rhinitis: Secondary | ICD-10-CM

## 2021-04-16 DIAGNOSIS — Z0000 Encounter for general adult medical examination without abnormal findings: Secondary | ICD-10-CM

## 2021-04-16 DIAGNOSIS — E538 Deficiency of other specified B group vitamins: Secondary | ICD-10-CM | POA: Diagnosis not present

## 2021-04-16 NOTE — Addendum Note (Signed)
Addended by: Kern Reap B on: 04/16/2021 04:00 PM   Modules accepted: Orders

## 2021-04-16 NOTE — Addendum Note (Signed)
Addended by: Kern Reap B on: 04/16/2021 04:02 PM   Modules accepted: Orders

## 2021-04-16 NOTE — Progress Notes (Signed)
Established Patient Office Visit     This visit occurred during the SARS-CoV-2 public health emergency.  Safety protocols were in place, including screening questions prior to the visit, additional usage of staff PPE, and extensive cleaning of exam room while observing appropriate contact time as indicated for disinfecting solutions.    CC/Reason for Visit: Annual preventive exam  HPI: Andrew Simpson is a 25 y.o. male who is coming in today for the above mentioned reasons. Past Medical History is significant for: Vitamin B12 deficiency.  He is requesting referral to an eye doctor and an allergist as he has been having worsening issues with seasonal allergies despite use of antihistamines and guaifenesin.  He is overdue for flu vaccine and COVID booster.  He has no acute concerns today.   Past Medical/Surgical History: Past Medical History:  Diagnosis Date   Allergy    B12 deficiency    Hypertension     Past Surgical History:  Procedure Laterality Date   NO PRIOR SURGERIES  03/29/2019    Social History:  reports that he has never smoked. He has never used smokeless tobacco. He reports that he does not drink alcohol and does not use drugs.  Allergies: Allergies  Allergen Reactions   Lactose Intolerance (Gi)     Family History:  Family History  Problem Relation Age of Onset   Liver cancer Father        73-42   Alcoholism Father    Colon cancer Neg Hx    Colon polyps Neg Hx    Esophageal cancer Neg Hx    Rectal cancer Neg Hx    Stomach cancer Neg Hx      Current Outpatient Medications:    guaiFENesin (ROBITUSSIN) 100 MG/5ML liquid, Take 200 mg by mouth 3 (three) times daily as needed for cough or congestion. (Patient not taking: Reported on 04/16/2021), Disp: , Rfl:    loratadine (CLARITIN) 10 MG tablet, Take 10 mg by mouth daily as needed for allergies. (Patient not taking: Reported on 04/16/2021), Disp: , Rfl:    sertraline (ZOLOFT) 50 MG tablet, Take 1  tablet (50 mg total) by mouth daily. (Patient not taking: Reported on 04/16/2021), Disp: 90 tablet, Rfl: 1  Review of Systems:  Constitutional: Denies fever, chills, diaphoresis, appetite change and fatigue.  HEENT: Denies photophobia, eye pain, redness, hearing loss, ear pain, congestion, sore throat, rhinorrhea, sneezing, mouth sores, trouble swallowing, neck pain, neck stiffness and tinnitus.   Respiratory: Denies SOB, DOE, cough, chest tightness,  and wheezing.   Cardiovascular: Denies chest pain, palpitations and leg swelling.  Gastrointestinal: Denies nausea, vomiting, abdominal pain, diarrhea, constipation, blood in stool and abdominal distention.  Genitourinary: Denies dysuria, urgency, frequency, hematuria, flank pain and difficulty urinating.  Endocrine: Denies: hot or cold intolerance, sweats, changes in hair or nails, polyuria, polydipsia. Musculoskeletal: Denies myalgias, back pain, joint swelling, arthralgias and gait problem.  Skin: Denies pallor, rash and wound.  Neurological: Denies dizziness, seizures, syncope, weakness, light-headedness, numbness and headaches.  Hematological: Denies adenopathy. Easy bruising, personal or family bleeding history  Psychiatric/Behavioral: Denies suicidal ideation, mood changes, confusion, nervousness, sleep disturbance and agitation    Physical Exam: Vitals:   04/16/21 1528  BP: 128/82  Pulse: (!) 52  Temp: 98.4 F (36.9 C)  TempSrc: Oral  SpO2: 99%  Weight: 186 lb 11.2 oz (84.7 kg)  Height: 5\' 4"  (1.626 m)    Body mass index is 32.05 kg/m.   Constitutional: NAD, calm, comfortable Eyes: PERRL,  lids and conjunctivae normal, wears corrective lenses ENMT: Mucous membranes are moist. Posterior pharynx clear of any exudate or lesions. Normal dentition. Tympanic membrane is pearly white, no erythema or bulging. Neck: normal, supple, no masses, no thyromegaly Respiratory: clear to auscultation bilaterally, no wheezing, no crackles.  Normal respiratory effort. No accessory muscle use.  Cardiovascular: Regular rate and rhythm, no murmurs / rubs / gallops. No extremity edema. 2+ pedal pulses. No carotid bruits.  Abdomen: no tenderness, no masses palpated. No hepatosplenomegaly. Bowel sounds positive.  Musculoskeletal: no clubbing / cyanosis. No joint deformity upper and lower extremities. Good ROM, no contractures. Normal muscle tone.  Skin: no rashes, lesions, ulcers. No induration Neurologic: CN 2-12 grossly intact. Sensation intact, DTR normal. Strength 5/5 in all 4.  Psychiatric: Normal judgment and insight. Alert and oriented x 3. Normal mood.    Impression and Plan:  Encounter for preventive health examination -Recommend routine eye and dental care. -Immunizations: Flu vaccine in office today, COVID booster recommended at pharmacy. -Healthy lifestyle discussed in detail. -Labs to be updated today. -Colon cancer screening: Not applicable -Breast cancer screening: Not applicable -Cervical cancer screening: Not applicable -Lung cancer screening: Not applicable -Prostate cancer screening: Not applicable -DEXA: Not applicable  123456 deficiency  - Plan: Vitamin B12  Seasonal allergies  - Plan: Ambulatory referral to Allergy  Need for influenza vaccination -Flu vaccine administered today.    Patient Instructions  -Nice seeing you today!!  -Lab work today; will notify you once results are available.  -Flu vaccine today.  -Remember your COVID booster at the pharmacy.  Referrals to eye doctor and allergist placed today.     Lelon Frohlich, MD [facility] at [facility]

## 2021-04-16 NOTE — Patient Instructions (Signed)
-  Nice seeing you today!!  -Lab work today; will notify you once results are available.  -Flu vaccine today.  -Remember your COVID booster at the pharmacy.  Referrals to eye doctor and allergist placed today.

## 2021-04-17 LAB — VITAMIN B12: Vitamin B-12: 189 pg/mL — ABNORMAL LOW (ref 211–911)

## 2021-04-28 ENCOUNTER — Ambulatory Visit (INDEPENDENT_AMBULATORY_CARE_PROVIDER_SITE_OTHER): Payer: PRIVATE HEALTH INSURANCE

## 2021-04-28 DIAGNOSIS — E538 Deficiency of other specified B group vitamins: Secondary | ICD-10-CM

## 2021-04-28 MED ORDER — CYANOCOBALAMIN 1000 MCG/ML IJ SOLN
1000.0000 ug | INTRAMUSCULAR | Status: AC
  Administered 2021-04-28 – 2021-09-16 (×2): 1000 ug via INTRAMUSCULAR

## 2021-04-28 NOTE — Progress Notes (Signed)
Pt here for monthly B12 injection per Dr [name].  B12 given IM in right deltoid and pt tolerated injection well.  Next B12 injection scheduled for 05/28/21.

## 2021-05-28 ENCOUNTER — Ambulatory Visit: Payer: PRIVATE HEALTH INSURANCE

## 2021-07-09 ENCOUNTER — Ambulatory Visit (INDEPENDENT_AMBULATORY_CARE_PROVIDER_SITE_OTHER): Payer: 59 | Admitting: *Deleted

## 2021-07-09 DIAGNOSIS — E538 Deficiency of other specified B group vitamins: Secondary | ICD-10-CM | POA: Diagnosis not present

## 2021-07-09 MED ORDER — CYANOCOBALAMIN 1000 MCG/ML IJ SOLN
1000.0000 ug | Freq: Once | INTRAMUSCULAR | Status: AC
  Administered 2021-07-09: 1000 ug via INTRAMUSCULAR

## 2021-07-09 NOTE — Progress Notes (Signed)
Per orders of Dr. Dana Allan, injection of Cyanocobalamin 1080mcg given by Agnes Lawrence. Patient tolerated injection well.

## 2021-09-16 ENCOUNTER — Ambulatory Visit (INDEPENDENT_AMBULATORY_CARE_PROVIDER_SITE_OTHER): Payer: 59

## 2021-09-16 DIAGNOSIS — E538 Deficiency of other specified B group vitamins: Secondary | ICD-10-CM | POA: Diagnosis not present

## 2021-09-16 NOTE — Progress Notes (Signed)
Pt here for monthly B12 injection per Dr. Hernandez. ? ?B12 1000mcg given IM left deltoid and pt tolerated injection well. ? ?Next B12 injection scheduled for 10/14/21. ? ?

## 2021-10-14 ENCOUNTER — Ambulatory Visit (INDEPENDENT_AMBULATORY_CARE_PROVIDER_SITE_OTHER): Payer: 59 | Admitting: *Deleted

## 2021-10-14 DIAGNOSIS — E538 Deficiency of other specified B group vitamins: Secondary | ICD-10-CM | POA: Diagnosis not present

## 2021-10-14 MED ORDER — CYANOCOBALAMIN 1000 MCG/ML IJ SOLN
1000.0000 ug | Freq: Once | INTRAMUSCULAR | Status: AC
  Administered 2021-10-14: 1000 ug via INTRAMUSCULAR

## 2021-10-14 NOTE — Progress Notes (Signed)
Pt here for monthly B12 injection per   B12 1000mcg given IM, and pt tolerated injection well.  Next B12 injection scheduled for x 1 month 

## 2021-11-04 ENCOUNTER — Ambulatory Visit (INDEPENDENT_AMBULATORY_CARE_PROVIDER_SITE_OTHER): Payer: 59 | Admitting: Internal Medicine

## 2021-11-04 VITALS — BP 110/80 | HR 88 | Temp 97.8°F | Wt 184.1 lb

## 2021-11-04 DIAGNOSIS — J302 Other seasonal allergic rhinitis: Secondary | ICD-10-CM

## 2021-11-04 DIAGNOSIS — E538 Deficiency of other specified B group vitamins: Secondary | ICD-10-CM | POA: Diagnosis not present

## 2021-11-04 LAB — VITAMIN B12: Vitamin B-12: 322 pg/mL (ref 211–911)

## 2021-11-04 MED ORDER — TACROLIMUS 0.1 % EX CREA: 1.0000 "application " | TOPICAL_CREAM | Freq: Every day | CUTANEOUS | 0 refills | Status: AC

## 2021-11-04 NOTE — Progress Notes (Signed)
Established Patient Office Visit     CC/Reason for Visit: Discuss some acute concerns  HPI: Andrew Simpson is a 26 y.o. male who is coming in today for the above mentioned reasons. Past Medical History is significant for: Seasonal allergies and vitamin B12 deficiency.  He has now been on monthly IM B12 since December and is requesting that his levels be checked.  He has also been having significant increase in allergic eye symptoms with dry, red itchy eyes and conjunctiva.  But it also involves the skin surrounding his upper and lower eyelid.  It is visibly flaky and dry today.   Past Medical/Surgical History: Past Medical History:  Diagnosis Date   Allergy    B12 deficiency    Hypertension     Past Surgical History:  Procedure Laterality Date   NO PRIOR SURGERIES  03/29/2019    Social History:  reports that he has never smoked. He has never used smokeless tobacco. He reports that he does not drink alcohol and does not use drugs.  Allergies: Allergies  Allergen Reactions   Lactose Intolerance (Gi)     Family History:  Family History  Problem Relation Age of Onset   Liver cancer Father        12-42   Alcoholism Father    Colon cancer Neg Hx    Colon polyps Neg Hx    Esophageal cancer Neg Hx    Rectal cancer Neg Hx    Stomach cancer Neg Hx      Current Outpatient Medications:    Cetirizine HCl 10 MG CAPS, , Disp: , Rfl:    Tacrolimus 0.1 % CREA, Apply 1 application. topically at bedtime., Disp: 30 g, Rfl: 0  Current Facility-Administered Medications:    cyanocobalamin ((VITAMIN B-12)) injection 1,000 mcg, 1,000 mcg, Intramuscular, Q30 days, Isaac Bliss, Rayford Halsted, MD, 1,000 mcg at 09/16/21 1613  Review of Systems:  Constitutional: Denies fever, chills, diaphoresis, appetite change and fatigue.  HEENT: Denies photophobia, mouth sores, trouble swallowing, neck pain, neck stiffness and tinnitus.   Respiratory: Denies SOB, DOE, cough, chest tightness,   and wheezing.   Cardiovascular: Denies chest pain, palpitations and leg swelling.  Gastrointestinal: Denies nausea, vomiting, abdominal pain, diarrhea, constipation, blood in stool and abdominal distention.  Genitourinary: Denies dysuria, urgency, frequency, hematuria, flank pain and difficulty urinating.  Endocrine: Denies: hot or cold intolerance, sweats, changes in hair or nails, polyuria, polydipsia. Musculoskeletal: Denies myalgias, back pain, joint swelling, arthralgias and gait problem.  Skin: Denies pallor, rash and wound.  Neurological: Denies dizziness, seizures, syncope, weakness, light-headedness, numbness and headaches.  Hematological: Denies adenopathy. Easy bruising, personal or family bleeding history  Psychiatric/Behavioral: Denies suicidal ideation, mood changes, confusion, nervousness, sleep disturbance and agitation    Physical Exam: Vitals:   11/04/21 1356  BP: 110/80  Pulse: 88  Temp: 97.8 F (36.6 C)  TempSrc: Oral  SpO2: 98%  Weight: 184 lb 1.6 oz (83.5 kg)    Body mass index is 31.6 kg/m.   Constitutional: NAD, calm, comfortable Eyes: PERRL, there is scleral and conjunctival mild injection bilaterally, there is flaky skin of upper and lower eyelids Psychiatric: Normal judgment and insight. Alert and oriented x 3. Normal mood.    Impression and Plan:  B12 deficiency  - Plan: Vitamin B12  Seasonal allergies  - Plan: Tacrolimus 0.1 % CREA, Ambulatory referral to Allergy -He will also do olopatadine eyedrops and continue daily antihistamine.    Time spent:30 minutes reviewing chart, interviewing  and examining patient and formulating plan of care.    Lelon Frohlich, MD [facility]

## 2021-11-18 ENCOUNTER — Ambulatory Visit (INDEPENDENT_AMBULATORY_CARE_PROVIDER_SITE_OTHER): Payer: 59 | Admitting: *Deleted

## 2021-11-18 DIAGNOSIS — E538 Deficiency of other specified B group vitamins: Secondary | ICD-10-CM | POA: Diagnosis not present

## 2021-11-18 MED ORDER — CYANOCOBALAMIN 1000 MCG/ML IJ SOLN
1000.0000 ug | Freq: Once | INTRAMUSCULAR | Status: AC
  Administered 2021-11-18: 1000 ug via INTRAMUSCULAR

## 2021-11-18 NOTE — Progress Notes (Signed)
Pt here for monthly B12 injection   B12 given IM, and pt tolerated injection well.  Next B12 injection scheduled for 1 x m

## 2022-01-12 ENCOUNTER — Ambulatory Visit (INDEPENDENT_AMBULATORY_CARE_PROVIDER_SITE_OTHER): Payer: 59 | Admitting: *Deleted

## 2022-01-12 DIAGNOSIS — E538 Deficiency of other specified B group vitamins: Secondary | ICD-10-CM | POA: Diagnosis not present

## 2022-01-12 MED ORDER — CYANOCOBALAMIN 1000 MCG/ML IJ SOLN
1000.0000 ug | Freq: Once | INTRAMUSCULAR | Status: AC
  Administered 2022-01-12: 1000 ug via INTRAMUSCULAR

## 2022-01-12 NOTE — Progress Notes (Signed)
Per orders of Dr. Burchette, injection of b12 given by Taylar Hartsough. Patient tolerated injection well. 

## 2022-03-09 ENCOUNTER — Encounter: Payer: Self-pay | Admitting: Internal Medicine

## 2022-03-15 ENCOUNTER — Other Ambulatory Visit: Payer: Self-pay | Admitting: Internal Medicine

## 2022-03-15 MED ORDER — "BD SAFETYGLIDE SYRINGE/NEEDLE 25G X 1"" 3 ML MISC": 11 refills | Status: AC

## 2022-03-15 MED ORDER — CYANOCOBALAMIN 1000 MCG/ML IJ SOLN: 1000.0000 ug | Freq: Once | INTRAMUSCULAR | 0 refills | Status: DC

## 2022-04-11 ENCOUNTER — Other Ambulatory Visit: Payer: Self-pay | Admitting: Internal Medicine

## 2022-05-04 ENCOUNTER — Other Ambulatory Visit: Payer: Self-pay | Admitting: Internal Medicine

## 2022-07-11 IMAGING — CT CT RENAL STONE PROTOCOL
2 of 4 series · 17 of 46 positions shown, 19 images · non-contrast
Comparison: 08/23/2018.

CLINICAL DATA: Left-sided flank pain of acute onset.

EXAM:
CT ABDOMEN AND PELVIS WITHOUT CONTRAST
TECHNIQUE: Multidetector CT imaging of the abdomen and pelvis was performed
following the standard protocol without IV contrast.

[Series 2: axial st · axial · 0.79mm/px · z∈[-430,-10]mm · 14 of 94 slices shown, 16 images]
[im 5/94  soft-tissue]
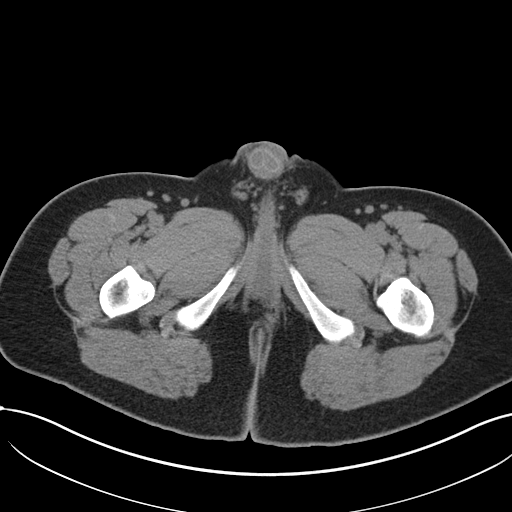
[im 5/94  bone]
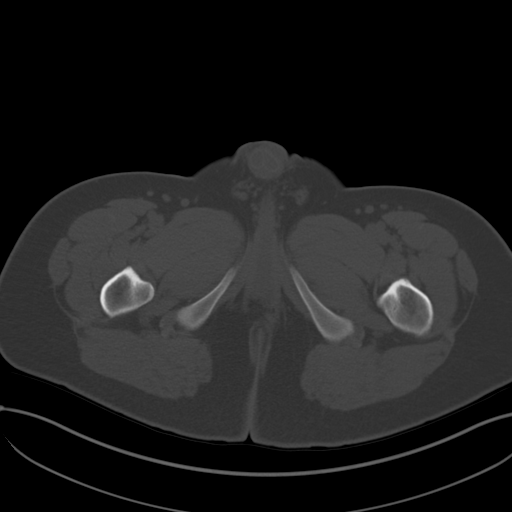
[im 10/94  soft-tissue]
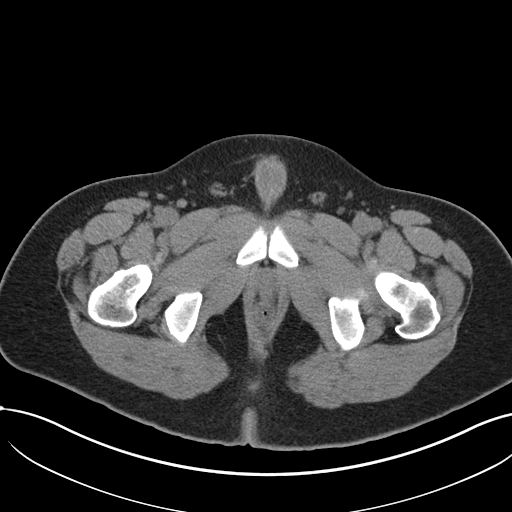
[im 20/94  soft-tissue]
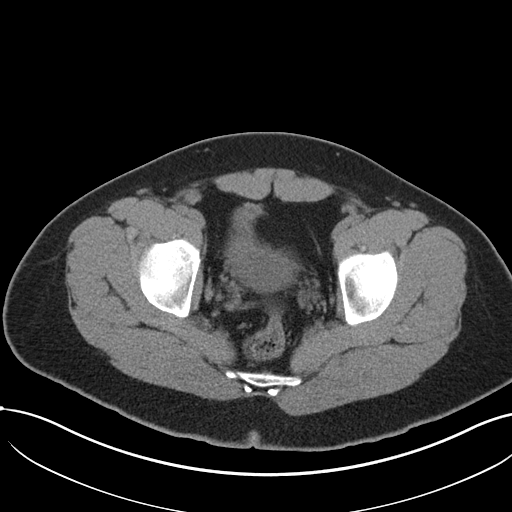
[im 25/94  soft-tissue]
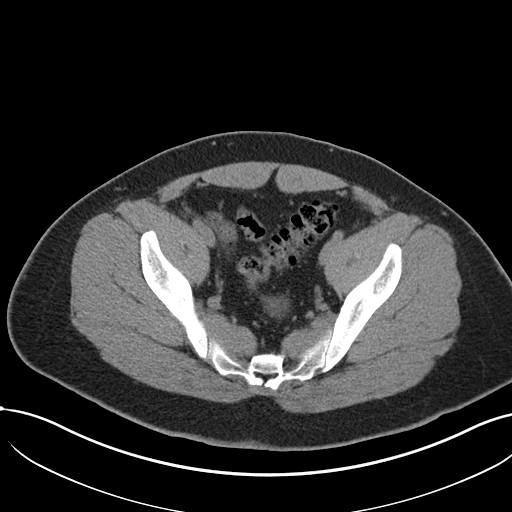
[im 30/94  soft-tissue]
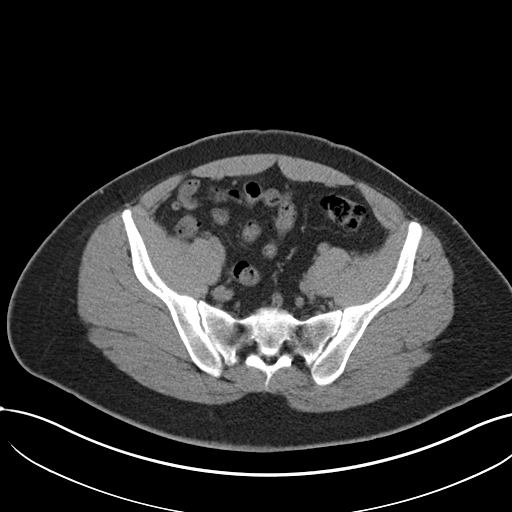
[im 40/94  soft-tissue]
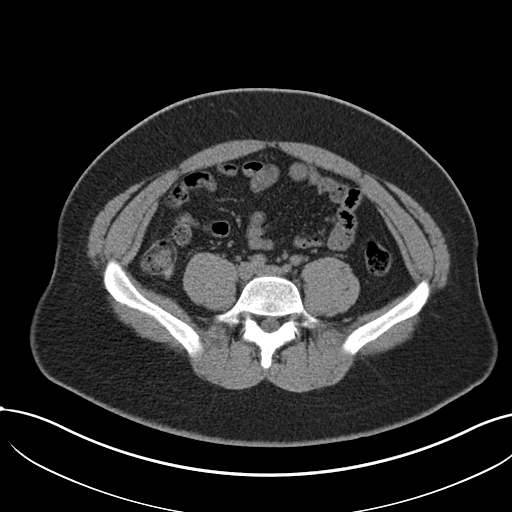
[im 45/94  soft-tissue]
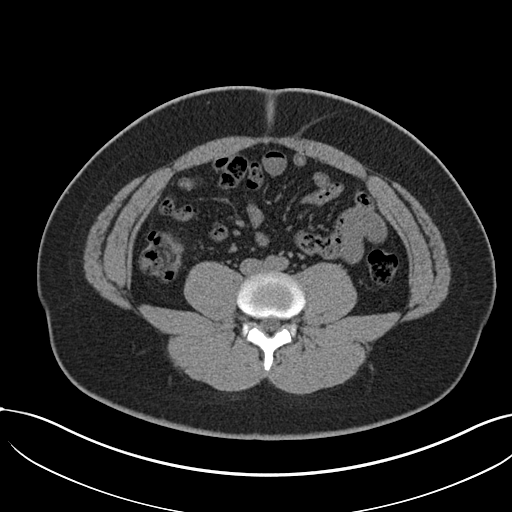
[im 49/94  soft-tissue]
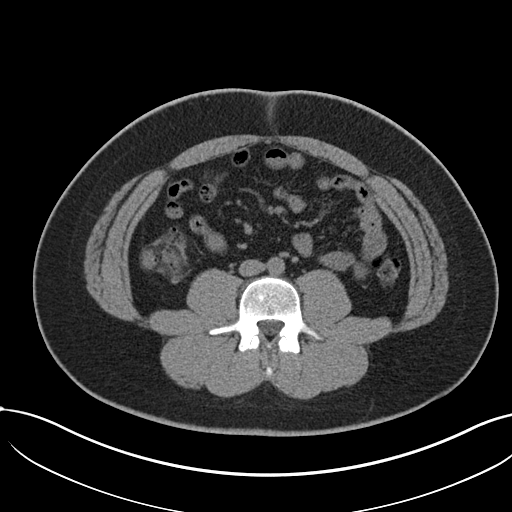
[im 54/94  soft-tissue]
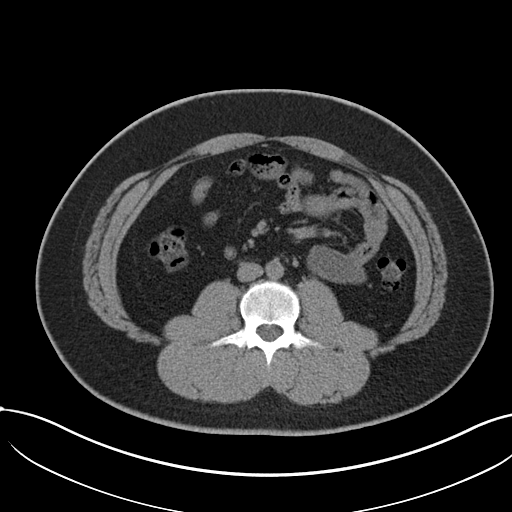
[im 54/94  bone]
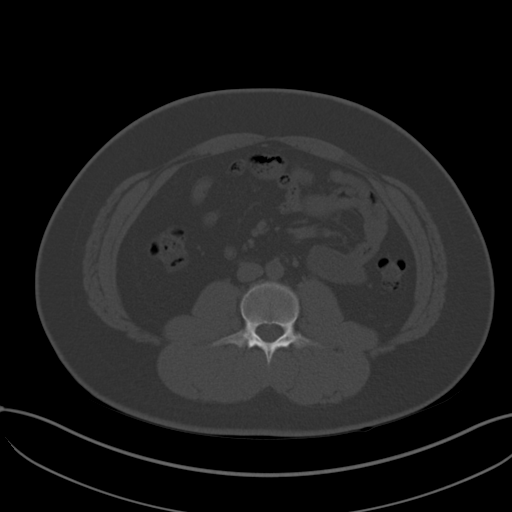
[im 64/94  soft-tissue]
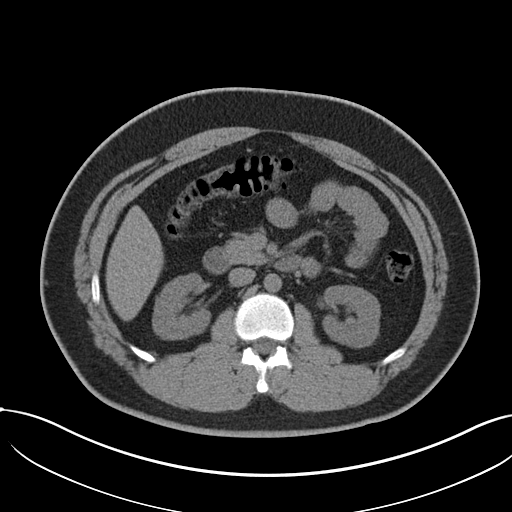
[im 69/94  soft-tissue]
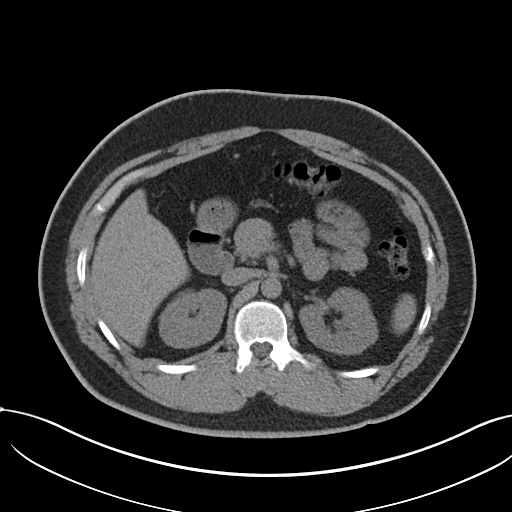
[im 74/94  soft-tissue]
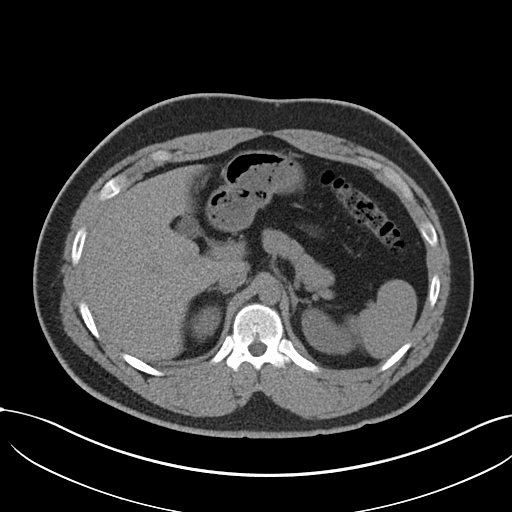
[im 84/94  soft-tissue]
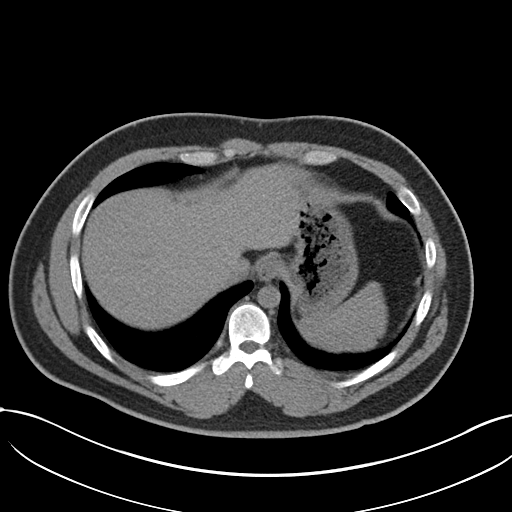
[im 89/94  soft-tissue]
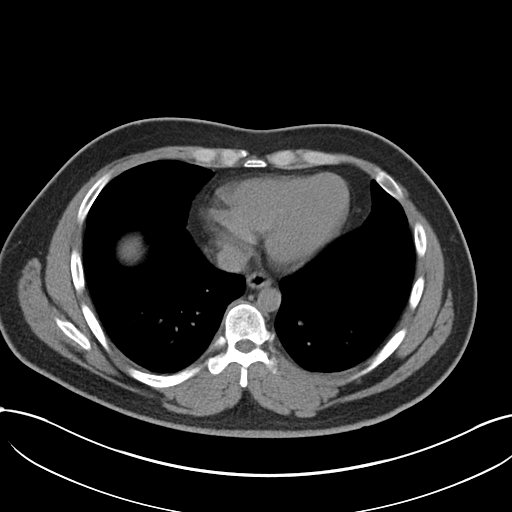

[Series 5: coronal · coronal · 0.85mm/px · 3 of 155 slices shown]
[im 52/155  soft-tissue]
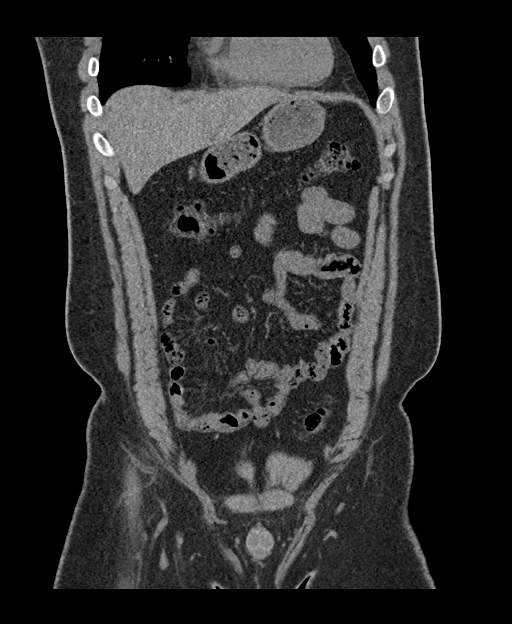
[im 69/155  soft-tissue]
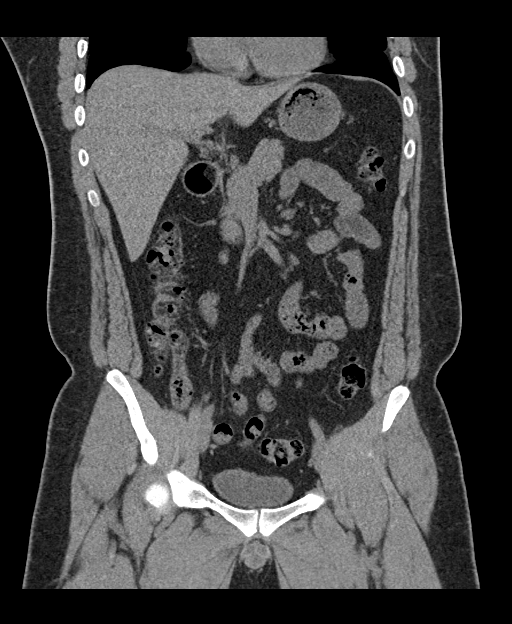
[im 86/155  soft-tissue]
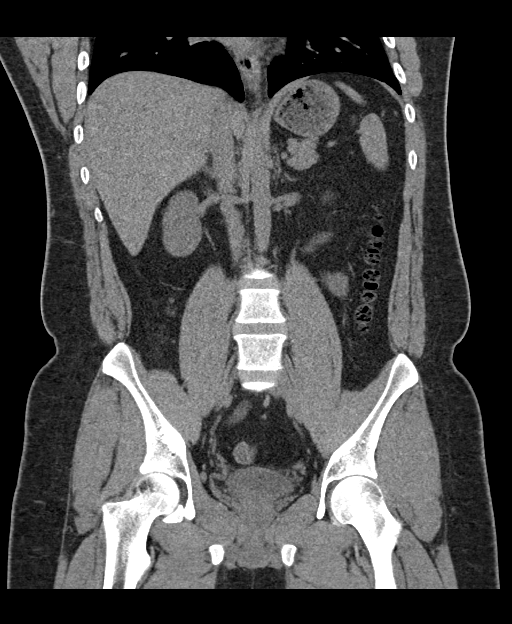

[17 of 46 positions shown; findings below may reference images not displayed]

FINDINGS: Lower chest: Normal

Hepatobiliary: Normal

Pancreas: Normal

Spleen: Normal

Adrenals/Urinary Tract: Adrenal glands are normal. Left kidney is
normal. Right kidney contains a 2 mm nonobstructing stone in the
midportion. No hydroureteronephrosis. No visible passing stone. No
stone in the bladder. No stones visible in the urethra.

Stomach/Bowel: Normal.  Appendix is normal.

Vascular/Lymphatic: Normal

Reproductive: Normal

Other: No free fluid or air.

Musculoskeletal: Normal.  No hernia.
IMPRESSION: 2 mm nonobstructing stone in the midportion of the right kidney. No
hydroureteronephrosis. No visible passing stone. No stone in the
bladder.
